# Patient Record
Sex: Female | Born: 1980 | Race: Black or African American | Hispanic: No
Health system: Southern US, Community
[De-identification: ages and names within clinical notes are randomized; demographics above are authoritative.]

---

## 2018-10-20 ENCOUNTER — Encounter: Payer: Self-pay | Admitting: Gastroenterology

## 2018-11-03 ENCOUNTER — Other Ambulatory Visit
Admission: RE | Admit: 2018-11-03 | Discharge: 2018-11-03 | Disposition: A | Discharge status: Home / Self Care | Payer: Medicaid Other | Source: Ambulatory Visit | Attending: Dermatopathology | Admitting: Dermatopathology

## 2018-11-03 DIAGNOSIS — D492 Neoplasm of unspecified behavior of bone, soft tissue, and skin: Secondary | ICD-10-CM | POA: Insufficient documentation

## 2018-11-04 LAB — SURGICAL PATHOLOGY

## 2019-01-20 ENCOUNTER — Telehealth: Payer: Self-pay

## 2019-01-20 NOTE — Telephone Encounter (Signed)
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

## 2019-01-21 NOTE — Telephone Encounter (Signed)
[place]

## 2020-07-09 ENCOUNTER — Emergency Department (HOSPITAL_COMMUNITY): Payer: Medicaid Other

## 2020-07-09 ENCOUNTER — Inpatient Hospital Stay (HOSPITAL_COMMUNITY): Payer: Medicaid Other

## 2020-07-09 ENCOUNTER — Inpatient Hospital Stay (HOSPITAL_COMMUNITY)
Admission: EM | Admit: 2020-07-09 | Discharge: 2020-07-12 | DRG: 062 | Disposition: A | Discharge status: Home / Self Care | Payer: Medicaid Other | Attending: Neurology | Admitting: Neurology

## 2020-07-09 ENCOUNTER — Other Ambulatory Visit: Payer: Self-pay

## 2020-07-09 ENCOUNTER — Encounter (HOSPITAL_COMMUNITY): Payer: Self-pay | Admitting: Neurology

## 2020-07-09 DIAGNOSIS — I6389 Other cerebral infarction: Secondary | ICD-10-CM | POA: Diagnosis not present

## 2020-07-09 DIAGNOSIS — Z20822 Contact with and (suspected) exposure to covid-19: Secondary | ICD-10-CM | POA: Diagnosis present

## 2020-07-09 DIAGNOSIS — I161 Hypertensive emergency: Secondary | ICD-10-CM | POA: Diagnosis present

## 2020-07-09 DIAGNOSIS — K5090 Crohn's disease, unspecified, without complications: Secondary | ICD-10-CM | POA: Diagnosis present

## 2020-07-09 DIAGNOSIS — E669 Obesity, unspecified: Secondary | ICD-10-CM | POA: Diagnosis present

## 2020-07-09 DIAGNOSIS — I739 Peripheral vascular disease, unspecified: Secondary | ICD-10-CM | POA: Diagnosis present

## 2020-07-09 DIAGNOSIS — I63311 Cerebral infarction due to thrombosis of right middle cerebral artery: Secondary | ICD-10-CM

## 2020-07-09 DIAGNOSIS — E785 Hyperlipidemia, unspecified: Secondary | ICD-10-CM | POA: Diagnosis present

## 2020-07-09 DIAGNOSIS — Z8673 Personal history of transient ischemic attack (TIA), and cerebral infarction without residual deficits: Secondary | ICD-10-CM

## 2020-07-09 DIAGNOSIS — G8194 Hemiplegia, unspecified affecting left nondominant side: Secondary | ICD-10-CM | POA: Diagnosis present

## 2020-07-09 DIAGNOSIS — Z6832 Body mass index (BMI) 32.0-32.9, adult: Secondary | ICD-10-CM | POA: Diagnosis not present

## 2020-07-09 DIAGNOSIS — R29704 NIHSS score 4: Secondary | ICD-10-CM | POA: Diagnosis present

## 2020-07-09 DIAGNOSIS — I10 Essential (primary) hypertension: Secondary | ICD-10-CM | POA: Diagnosis present

## 2020-07-09 DIAGNOSIS — I639 Cerebral infarction, unspecified: Principal | ICD-10-CM | POA: Diagnosis present

## 2020-07-09 DIAGNOSIS — I160 Hypertensive urgency: Secondary | ICD-10-CM | POA: Diagnosis present

## 2020-07-09 DIAGNOSIS — E782 Mixed hyperlipidemia: Secondary | ICD-10-CM | POA: Diagnosis not present

## 2020-07-09 DIAGNOSIS — Z9282 Status post administration of tPA (rtPA) in a different facility within the last 24 hours prior to admission to current facility: Secondary | ICD-10-CM | POA: Diagnosis not present

## 2020-07-09 DIAGNOSIS — G459 Transient cerebral ischemic attack, unspecified: Secondary | ICD-10-CM | POA: Diagnosis not present

## 2020-07-09 LAB — DIFFERENTIAL
Abs Immature Granulocytes: 0.01 10*3/uL (ref 0.00–0.07)
Basophils Absolute: 0 10*3/uL (ref 0.0–0.1)
Basophils Relative: 1 %
Eosinophils Absolute: 0.2 10*3/uL (ref 0.0–0.5)
Eosinophils Relative: 3 %
Immature Granulocytes: 0 %
Lymphocytes Relative: 38 %
Lymphs Abs: 2.1 10*3/uL (ref 0.7–4.0)
Monocytes Absolute: 0.6 10*3/uL (ref 0.1–1.0)
Monocytes Relative: 11 %
Neutro Abs: 2.6 10*3/uL (ref 1.7–7.7)
Neutrophils Relative %: 47 %

## 2020-07-09 LAB — COMPREHENSIVE METABOLIC PANEL
ALT: 13 U/L (ref 0–44)
AST: 21 U/L (ref 15–41)
Albumin: 3.3 g/dL — ABNORMAL LOW (ref 3.5–5.0)
Alkaline Phosphatase: 52 U/L (ref 38–126)
Anion gap: 11 (ref 5–15)
BUN: 20 mg/dL (ref 6–20)
CO2: 19 mmol/L — ABNORMAL LOW (ref 22–32)
Calcium: 8.9 mg/dL (ref 8.9–10.3)
Chloride: 105 mmol/L (ref 98–111)
Creatinine, Ser: 1.49 mg/dL — ABNORMAL HIGH (ref 0.44–1.00)
GFR, Estimated: 45 mL/min — ABNORMAL LOW (ref >60)
Glucose, Bld: 77 mg/dL (ref 70–99)
Potassium: 3.8 mmol/L (ref 3.5–5.1)
Sodium: 135 mmol/L (ref 135–145)
Total Bilirubin: 0.5 mg/dL (ref 0.3–1.2)
Total Protein: 8 g/dL (ref 6.5–8.1)

## 2020-07-09 LAB — I-STAT CHEM 8, ED
BUN: 24 mg/dL — ABNORMAL HIGH (ref 6–20)
Calcium, Ion: 0.95 mmol/L — ABNORMAL LOW (ref 1.15–1.40)
Chloride: 107 mmol/L (ref 98–111)
Creatinine, Ser: 1.5 mg/dL — ABNORMAL HIGH (ref 0.44–1.00)
Glucose, Bld: 79 mg/dL (ref 70–99)
HCT: 41 % (ref 36.0–46.0)
Hemoglobin: 13.9 g/dL (ref 12.0–15.0)
Potassium: 3.6 mmol/L (ref 3.5–5.1)
Sodium: 135 mmol/L (ref 135–145)
TCO2: 22 mmol/L (ref 22–32)

## 2020-07-09 LAB — CBC
HCT: 41.4 % (ref 36.0–46.0)
Hemoglobin: 12.9 g/dL (ref 12.0–15.0)
MCH: 25.7 pg — ABNORMAL LOW (ref 26.0–34.0)
MCHC: 31.2 g/dL (ref 30.0–36.0)
MCV: 82.6 fL (ref 80.0–100.0)
Platelets: 282 10*3/uL (ref 150–400)
RBC: 5.01 MIL/uL (ref 3.87–5.11)
RDW: 16.3 % — ABNORMAL HIGH (ref 11.5–15.5)
WBC: 5.6 10*3/uL (ref 4.0–10.5)
nRBC: 0 % (ref 0.0–0.2)

## 2020-07-09 LAB — I-STAT BETA HCG BLOOD, ED (MC, WL, AP ONLY): I-stat hCG, quantitative: <5 m[IU]/mL (ref <5)

## 2020-07-09 LAB — RESP PANEL BY RT-PCR (FLU A&B, COVID) ARPGX2
Influenza A by PCR: NEGATIVE
Influenza B by PCR: NEGATIVE
SARS Coronavirus 2 by RT PCR: NEGATIVE

## 2020-07-09 LAB — PROTIME-INR
INR: 1 (ref 0.8–1.2)
Prothrombin Time: 12.9 seconds (ref 11.4–15.2)

## 2020-07-09 LAB — HIV ANTIBODY (ROUTINE TESTING W REFLEX): HIV Screen 4th Generation wRfx: NONREACTIVE

## 2020-07-09 LAB — CBG MONITORING, ED: Glucose-Capillary: 77 mg/dL (ref 70–99)

## 2020-07-09 LAB — APTT: aPTT: 29 seconds (ref 24–36)

## 2020-07-09 MED ORDER — SODIUM CHLORIDE 0.9 % IV BOLUS
1000.0000 mL | Freq: Once | INTRAVENOUS | Status: AC
  Administered 2020-07-09: 1000 mL via INTRAVENOUS

## 2020-07-09 MED ORDER — CLEVIDIPINE BUTYRATE 0.5 MG/ML IV EMUL
0.0000 mg/h | INTRAVENOUS | Status: DC
  Administered 2020-07-09: 14 mg/h via INTRAVENOUS
  Administered 2020-07-09 (×2): 21 mg/h via INTRAVENOUS
  Administered 2020-07-09: 16 mg/h via INTRAVENOUS
  Administered 2020-07-09: 21 mg/h via INTRAVENOUS
  Administered 2020-07-09: 20 mg/h via INTRAVENOUS
  Administered 2020-07-09: 15 mg/h via INTRAVENOUS
  Administered 2020-07-10: 18 mg/h via INTRAVENOUS
  Administered 2020-07-10 (×2): 21 mg/h via INTRAVENOUS
  Administered 2020-07-10: 6 mg/h via INTRAVENOUS
  Administered 2020-07-11: 4 mg/h via INTRAVENOUS
  Filled 2020-07-09: qty 50
  Filled 2020-07-09 (×5): qty 100
  Filled 2020-07-09 (×3): qty 50
  Filled 2020-07-09 (×2): qty 100

## 2020-07-09 MED ORDER — SODIUM CHLORIDE 0.9% FLUSH
3.0000 mL | Freq: Once | INTRAVENOUS | Status: AC
  Administered 2020-07-09: 3 mL via INTRAVENOUS

## 2020-07-09 MED ORDER — IOHEXOL 350 MG/ML SOLN
51.0000 mL | Freq: Once | INTRAVENOUS | Status: AC | PRN
  Administered 2020-07-09: 51 mL via INTRAVENOUS

## 2020-07-09 MED ORDER — ACETAMINOPHEN 650 MG RE SUPP: 650.0000 mg | RECTAL | Status: DC | PRN

## 2020-07-09 MED ORDER — ACETAMINOPHEN 325 MG PO TABS
650.0000 mg | ORAL_TABLET | ORAL | Status: DC | PRN
  Administered 2020-07-09: 650 mg via ORAL
  Filled 2020-07-09: qty 2

## 2020-07-09 MED ORDER — SODIUM CHLORIDE 0.9 % IV SOLN: 50.0000 mL | Freq: Once | INTRAVENOUS | Status: DC

## 2020-07-09 MED ORDER — MAGNESIUM SULFATE IN D5W 1-5 GM/100ML-% IV SOLN
1.0000 g | Freq: Once | INTRAVENOUS | Status: AC
  Administered 2020-07-09: 1 g via INTRAVENOUS
  Filled 2020-07-09: qty 100

## 2020-07-09 MED ORDER — LABETALOL HCL 5 MG/ML IV SOLN: 20.0000 mg | Freq: Once | INTRAVENOUS | Status: AC

## 2020-07-09 MED ORDER — LABETALOL HCL 5 MG/ML IV SOLN
INTRAVENOUS | Status: AC
  Filled 2020-07-09: qty 4

## 2020-07-09 MED ORDER — LABETALOL HCL 5 MG/ML IV SOLN
INTRAVENOUS | Status: AC
  Administered 2020-07-09: 5 mg
  Filled 2020-07-09: qty 4

## 2020-07-09 MED ORDER — ORAL CARE MOUTH RINSE
15.0000 mL | Freq: Two times a day (BID) | OROMUCOSAL | Status: DC
  Administered 2020-07-09: 15 mL via OROMUCOSAL

## 2020-07-09 MED ORDER — DIPHENHYDRAMINE HCL 50 MG/ML IJ SOLN
25.0000 mg | Freq: Once | INTRAMUSCULAR | Status: AC
  Administered 2020-07-09: 25 mg via INTRAVENOUS
  Filled 2020-07-09: qty 1

## 2020-07-09 MED ORDER — NICARDIPINE HCL IN NACL 20-0.86 MG/200ML-% IV SOLN: 0.0000 mg/h | INTRAVENOUS | Status: DC | PRN

## 2020-07-09 MED ORDER — PANTOPRAZOLE SODIUM 40 MG IV SOLR
40.0000 mg | Freq: Every day | INTRAVENOUS | Status: DC
  Administered 2020-07-09 – 2020-07-10 (×2): 40 mg via INTRAVENOUS
  Filled 2020-07-09 (×2): qty 40

## 2020-07-09 MED ORDER — ALTEPLASE (STROKE) FULL DOSE INFUSION
0.9000 mg/kg | Freq: Once | INTRAVENOUS | Status: AC
  Administered 2020-07-09: 78.1 mg via INTRAVENOUS
  Filled 2020-07-09: qty 100

## 2020-07-09 MED ORDER — LABETALOL HCL 5 MG/ML IV SOLN
10.0000 mg | INTRAVENOUS | Status: DC | PRN
  Administered 2020-07-11 (×2): 10 mg via INTRAVENOUS
  Filled 2020-07-09 (×3): qty 4

## 2020-07-09 MED ORDER — ACETAMINOPHEN 160 MG/5ML PO SOLN: 650.0000 mg | ORAL | Status: DC | PRN

## 2020-07-09 MED ORDER — CLEVIDIPINE BUTYRATE 0.5 MG/ML IV EMUL
INTRAVENOUS | Status: AC
  Administered 2020-07-09: 1 mg/h
  Filled 2020-07-09: qty 50

## 2020-07-09 MED ORDER — SODIUM CHLORIDE 0.9 % IV SOLN
50.0000 mL/h | INTRAVENOUS | Status: DC
  Administered 2020-07-09: 50 mL/h via INTRAVENOUS

## 2020-07-09 MED ORDER — STROKE: EARLY STAGES OF RECOVERY BOOK
Freq: Once | Status: AC
  Administered 2020-07-10: 1
  Filled 2020-07-09: qty 1

## 2020-07-09 MED ORDER — LABETALOL HCL 5 MG/ML IV SOLN: 10.0000 mg | Freq: Once | INTRAVENOUS | Status: DC | PRN

## 2020-07-09 MED ORDER — ACETAMINOPHEN 325 MG PO TABS: 650.0000 mg | ORAL_TABLET | Freq: Once | ORAL | Status: DC

## 2020-07-09 NOTE — H&P (Addendum)
Neurology Admission H&P    Chief Complaint: left sided weakness with left face and arm numbness and tingling   HPI: Tanya Hamilton is a 40 yo female with a PMHx of HTN of Chrohn's who presented today as a code stroke. Patient was at work, and began to feel left sided weakness with noted left face and left arm numbness and tingling. Patient called 911 herself. Per EMS, she was showing left sided weakness en route, but no aphasia, HA, or dysarthria. No gaze preference.  CBG 94 and BP 222/170 en route.   After a brief exam on the ED bridge with airway clearance, patient was taken emergently to CT suite. CTH was negative for acute abnormality, but given strong suspicion for stroke, tPA was offered. Risk and benefits along with inclusion/exclusion criteria discussed with patient by Dr. Amada Jupiter and patient agreed with tPA. Prior to tPA, patient's BP was over 200. Labetolol 20mg  was given, then Clevipex started. After titration of Clexiprex to 16, her BP was within tPA guidelines and tPA was started.   Patient just moved here from 3 months ago. Hasn't established with a PCP yet. States she takes Losartan and Stelara. She denies previous stroke and has no FMHx of stroke.  LKW: 1335 tPA- around 1455-see charted time.  IR?-No, no LVO MRS-0  PMHx: HTN, Crohn's disease  History reviewed. No pertinent family history.   Social History: married, children, works. + tobacco use.   Allergies: Not on File  ROS: Negative ROS except as noted in HPI.   Physical Examination: General: Appears well, NAD.  Psych: Affect appropriate. Calm. Cooperative.  HEENT-  Normocephalic, AT. No lymphadenopathy. No thyromegaly. No stiffness of neck.  Cardiovascular - RRR. No LE edema.  Lungs - Normal respiratory effort. Abdomen - soft, NT. Extremities - warm, well perfused. Skin: WDI.  NEURO:   NIHSS:  1a Level of Consciousness: 0 1b LOC Questions: 0 1c LOC Commands: 0 2 Best Gaze: 0 3 Visual: 0 4 Facial  Palsy: 1 5a Motor Arm - left: 1 5b Motor Arm - Right: 0 6a Motor Leg - Left: 1 6b Motor Leg - Right: 0 7 Limb Ataxia: 0 8 Sensory: 1 9 Best Language: 0 10 Dysarthria: 0 11 Extinct and Inattention: 0 TOTAL: 4  Mental Status: Awake and alert. Is able to follow simple commands. Oriented to person, place, month, day, date. Able to relay why here.  Speech/Language: speech is without dysarthria.  No aphasia or neglect. No gaze preference. Naming, repetition, fluency, and comprehension intact.  Cranial Nerves:  II: PERRL. Visual fields full.  III, IV, VI: EOMI. Eyelids elevate symmetrically. Blinks to threat.  V: Sensation is intact to light touch and symmetrical to face.  VII: Light left facial droop.  IX, X: Palate elevates symmetrically. Phonation is normal.  [place] shrug 5/5. XII: tongue is midline without fasciculations. Motor:  RUE:  grip   4+/5    biceps  4+/5    triceps  4+/5     LUE: grip 4/5    biceps   4/5    triceps  4/5 RLE: thigh  5/5   knee   5/5   plantar flexion   5/5    dorsiflexion   5/5 LLE: thigh  4-/5    knee  4-/5   plantar flexion  4-/5   dorsiflexion  4-/5 Tone is normal and bulk is normal Sensation- Intact to light touch bilaterally. Extinction absent to light touch to DSS.  Coordination: FTN intact bilaterally.  Drift LUE/LLE Plantars: downgoing.  Gait- deferred  Labs pertinent to this encounter: INR 1     aPTT   29      Imaging: Independently reviewed by MD.   CTH 1. No evidence of acute large vascular territory infarct or acute hemorrhage. ASPECTS is 10. 2. Remote appearing left corona radiata infarct and mild patchy white matter hypoattenuation, most likely related to chronic microvascular ischemic disease. MRI could provide more sensitive evaluation for acute infarct if clinically indicated.  CTA head and neck- ________________________  Assessment: 40 yo female new to this area. Stroke risk factor of uncontrolled HTN and tobacco abuse.  Strong suspicion of stroke due to patient's symptoms and hypertension urgency. After patient consent and BP stabilized with Labetalol and Cleviprex, tPA was given. Patient will be admitted to ICU because of tPA infusion.   Plan: -admit by neurology.  -No anti platelets or AC x 24 hrs. Further AC or DAPT per team in a.m.  -No non compressible sticks ,invasive procedures, or tube placement x 24 hours.  -BP goal is below 180/105. -CTH 6 hours after tPA given or stat for any acute change in neurological status.  -MRI brain in 12 hours. -Cleviprex titration for BP goal of < 180/105.  -HgbA1c, fasting lipid panel, TSH.  -high intensity statin is LDL > 70. Do not start statin until further imaging is negative for bleed.  -No chemical VTE. SCDs only.  -Echocardiogram with bubble study. -Frequent neuro checks per stroke protocol.  -NIHSS per protocol.  - Risk factor modification. -Telemetry monitoring for arrhythmia.  -PT consult, OT consult, Speech consult. -infectious workup-CBC diff, UA and culture, LA, CXR, CK.  -stroke education.  -tobacco cessation if applicable.  - Stroke team to follow.  Patient seen by Jimmye Norman, NP/Neurology and MD. Note and plan to be edited by MD as necessary.    I have seen the patient and reviewed the above note.  I was present for the entirety of the evaluation and management reflected in the above note.  She has mild left-sided weakness, but given the degree of fine motor movement weakness in her left hand as well as the degree of weakness in her left leg I think that this could be a disabling deficit and therefore offered IV tPA.  After discussion of the risks and benefits of IV tPA with the patient, she agreed to proceed and tPA was started.  She had severe hypertension that was refractory to initial treatment with labetalol, requiring a continuous Cleviprex drip which did delay tPA to some degree.  This patient is critically ill and at significant  risk of neurological worsening, death and care requires constant monitoring of vital signs, hemodynamics,respiratory and cardiac monitoring, neurological assessment, discussion with family, other specialists and medical decision making of high complexity. I spent 60 minutes of neurocritical care time  in the care of  this patient. This was time spent independent of any time provided by nurse practitioner or PA.  Ritta Slot, MD [facility] [phone]  If 7pm- 7am, please page neurology on call as listed in AMION. 07/09/2020  7:32 PM

## 2020-07-09 NOTE — ED Provider Notes (Signed)
[facility] NEURO/TRAUMA/SURGICAL ICU Provider Note   CSN: [id number] Arrival date & time: 07/09/20  1424  An emergency department physician performed an initial assessment on this suspected stroke patient at 1424.  History Chief Complaint  Patient presents with  . Code Stroke    Tanya Hamilton is a 40 y.o. female.  HPI   40 year old female past medical history of HTN presents the emergency department concern for left-sided weakness.  This started acutely less than an hour prior to arrival.  Evaluated as a stroke page at the EMS bridge with neurology at bedside, sent straight to the Scanner.  Patient otherwise sitting up, conversational protecting her airway.   History reviewed. No pertinent past medical history.  Patient Active Problem List   Diagnosis Date Noted  . 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 No pertinent surgical history.   OB History   No obstetric history on file.     History reviewed. No pertinent family history.  Social History   Tobacco Use  . Smoking status: Unknown If Ever Smoked    Home Medications Prior to Admission medications   Not on File    Allergies    Patient has no allergy information on record.  Review of Systems   Review of Systems  Unable to perform ROS: Acuity of condition    Physical Exam Updated Vital Signs BP (!) 150/112   Pulse (!) 103   Temp 98.3 F (36.8 C)   Resp 15   Ht 5\' 4"  (1.626 m)   Wt 87.1 kg   SpO2 95%   BMI 32.96 kg/m   Physical Exam Vitals and nursing note reviewed.  Constitutional:      Appearance: Normal appearance.  HENT:     Head: Normocephalic.     Mouth/Throat:     Mouth: Mucous membranes are moist.  Eyes:     Pupils: Pupils are equal, round, and reactive to light.  Cardiovascular:     Rate and Rhythm: Tachycardia present.  Pulmonary:     Effort: Pulmonary effort is normal. No respiratory distress.  Abdominal:     Palpations: Abdomen is soft.  Skin:    General:  Skin is warm.  Neurological:     Mental Status: She is alert and oriented to person, place, and time.     Comments: Weakness in the left upper and lower extremity  Psychiatric:        Mood and Affect: Mood normal.     ED Results / Procedures / Treatments   Labs (all labs ordered are listed, but only abnormal results are displayed) Labs Reviewed  CBC - Abnormal; Notable for the following components:      Result Value   MCH 25.7 (*)    RDW 16.3 (*)    All other components within normal limits  COMPREHENSIVE METABOLIC PANEL - Abnormal; Notable for the following components:   CO2 19 (*)    Creatinine, Ser 1.49 (*)    Albumin 3.3 (*)    GFR, Estimated 45 (*)    All other components within normal limits  I-STAT CHEM 8, ED - Abnormal; Notable for the following components:   BUN 24 (*)    Creatinine, Ser 1.50 (*)    Calcium, Ion 0.95 (*)    All other components within normal limits  MRSA PCR SCREENING  RESP PANEL BY RT-PCR (FLU A&B, COVID) ARPGX2  PROTIME-INR  APTT  DIFFERENTIAL  HIV ANTIBODY (ROUTINE TESTING W REFLEX)  CBG MONITORING, ED  I-STAT BETA HCG BLOOD, ED ([facility], [facility], [facility] ONLY)    EKG None  Radiology CT HEAD CODE STROKE WO CONTRAST  Result Date: 07/09/2020 CLINICAL DATA:  Code stroke.  Left-sided weakness. EXAM: CT HEAD WITHOUT CONTRAST TECHNIQUE: Contiguous axial images were obtained from the base of the skull through the vertex without intravenous contrast. COMPARISON:  None. FINDINGS: Brain: No evidence of acute large vascular territory infarct or acute hemorrhage. Remote appearing left corona radiata infarct. No hydrocephalus, mass lesion, mass effect, or sizable extra-axial fluid collection. Mild patchy white matter hypoattenuation is nonspecific but most likely related to chronic microvascular ischemic disease. Vascular: No hyperdense vessel identified. Skull: No acute fracture. Sinuses/Orbits: Clear visualized sinuses. Other: No mastoid effusions. ASPECTS [facility]  Stroke Program Early CT Score) Total score (0-10 with 10 being normal): 10. IMPRESSION: 1. No evidence of acute large vascular territory infarct or acute hemorrhage. ASPECTS is 10. 2. Remote appearing left corona radiata infarct and mild patchy white matter hypoattenuation, most likely related to chronic microvascular ischemic disease. MRI could provide more sensitive evaluation for acute infarct if clinically indicated. Code stroke imaging results were communicated on 07/09/2020 at 2:52 pm to provider Dr. Amada Jupiter via secure text paging. Electronically Signed   By: Feliberto Harts MD   On: 07/09/2020 14:53    Procedures .Critical Care Performed by: Rozelle Logan, DO Authorized by: Rozelle Logan, DO   Critical care provider statement:    Critical care time (minutes):  45   Critical care was necessary to treat or prevent imminent or life-threatening deterioration of the following conditions:  CNS failure or compromise   Critical care was time spent personally by me on the following activities:  Discussions with consultants, evaluation of patient's response to treatment, examination of patient, ordering and performing treatments and interventions, ordering and review of laboratory studies, ordering and review of radiographic studies, pulse oximetry, re-evaluation of patient's condition, obtaining history from patient or surrogate and review of old charts     Medications Ordered in ED Medications   stroke: mapping our early stages of recovery book (has no administration in time range)  0.9 %  sodium chloride infusion (has no administration in time range)  acetaminophen (TYLENOL) tablet 650 mg (has no administration in time range)    Or  acetaminophen (TYLENOL) 160 MG/5ML solution 650 mg (has no administration in time range)    Or  acetaminophen (TYLENOL) suppository 650 mg (has no administration in time range)  pantoprazole (PROTONIX) injection 40 mg (has no administration in time  range)  alteplase (ACTIVASE) 1 mg/mL infusion 78.1 mg (78.1 mg Intravenous New Bag/Given 07/09/20 1456)    Followed by  0.9 %  sodium chloride infusion (has no administration in time range)  clevidipine (CLEVIPREX) infusion 0.5 mg/mL (14 mg/hr Intravenous New Bag/Given 07/09/20 1601)  sodium chloride flush (NS) 0.9 % injection 3 mL (3 mLs Intravenous Given 07/09/20 1436)  labetalol (NORMODYNE) injection 20 mg (5 mg Intravenous Given 07/09/20 1436)  iohexol (OMNIPAQUE) 350 MG/ML injection 51 mL (51 mLs Intravenous Contrast Given 07/09/20 1523)    ED Course  I have reviewed the triage vital signs and the nursing notes.  Pertinent labs & imaging results that were available during my care of the patient were reviewed by me and considered in my medical decision making (see chart for details).    MDM Rules/Calculators/A&P  40 year old female presents the emergency department with left-sided weakness that started acutely prior to arrival.  She is weak on neuro exam, hypertensive.  Evaluated as a stroke page at the EMS bridge, neurology team at bedside.  Head CT shows no acute abnormality, decision made to give the patient tPA, blood pressure control is currently being achieved by pharmacy who is also bedside.  tPA was started, patient admitted to the neurointensive care team, stable at time of admission.  Final Clinical Impression(s) / ED Diagnoses Final diagnoses:  None    Rx / DC Orders ED Discharge Orders    None       Rozelle Logan, DO 07/09/20 1624

## 2020-07-09 NOTE — Progress Notes (Signed)
Repeat scans clarified as the H&P note states to do a CTH 6 hours post-tPa and MRI 12 hours post-tPa but the orders placed are for 24 hours post-tPa.   Per Dr. Derry Lory perform scans as per usual protocol 24 hours post-tPa unless there are neuro changes.

## 2020-07-09 NOTE — Progress Notes (Signed)
Pharmacist Code Stroke Response  Notified to mix tPA at 1435 by Dr. Amada Jupiter Delivered tPA to RN at 1441  tPA dose = 7.8mg  bolus over 1 minute followed by 70.3 mg for a total dose of 78.1mg  over 1 hour  Issues/delays encountered (if applicable): blood pressure control. Required labetalol and cleviprex infusion.   Vinnie Level, PharmD., BCPS, BCCCP Clinical Pharmacist Please refer to [facility] for unit-specific pharmacist

## 2020-07-09 NOTE — ED Triage Notes (Signed)
PT BIB EMS CODE STROKE. PT LKN 1335. Pt reports sudden onset of left arm & leg weakness. Pt has h/o hypertension.

## 2020-07-09 NOTE — Progress Notes (Signed)
Pt complaining of new onset of a headache neuro assessment unchanged, Dr, Derry Lory aware, [facility] ordered.   During CT pt began complaining of slight L sided weakness, see documentation. Dr. Derry Lory aware.    Per Dr, Derry Lory, [facility] was negative for a bleed new orders received.

## 2020-07-09 NOTE — ED Notes (Signed)
Pt never went to room.Pt went from CT To ICU room

## 2020-07-09 NOTE — Progress Notes (Signed)
PT Cancellation Note  Patient Details Name: Tanya Hamilton MRN: [id number] DOB: [birth year]   Cancelled Treatment:    Reason Eval/Treat Not Completed: Active bedrest order. Will follow-up another day as appropriate.    Raymond Gurney, PT, DPT Acute Rehabilitation Services  Pager: [phone] Office: [phone]    Jewel Baize 07/09/2020, 5:43 PM

## 2020-07-10 ENCOUNTER — Other Ambulatory Visit (HOSPITAL_COMMUNITY): Payer: Medicaid Other

## 2020-07-10 ENCOUNTER — Inpatient Hospital Stay (HOSPITAL_COMMUNITY): Payer: Medicaid Other

## 2020-07-10 DIAGNOSIS — E782 Mixed hyperlipidemia: Secondary | ICD-10-CM

## 2020-07-10 DIAGNOSIS — I161 Hypertensive emergency: Secondary | ICD-10-CM

## 2020-07-10 DIAGNOSIS — I63311 Cerebral infarction due to thrombosis of right middle cerebral artery: Secondary | ICD-10-CM

## 2020-07-10 LAB — LIPID PANEL
Cholesterol: 187 mg/dL (ref 0–200)
HDL: 41 mg/dL (ref >40)
LDL Cholesterol: 75 mg/dL (ref 0–99)
Total CHOL/HDL Ratio: 4.6 RATIO
Triglycerides: 353 mg/dL — ABNORMAL HIGH (ref <150)
VLDL: 71 mg/dL — ABNORMAL HIGH (ref 0–40)

## 2020-07-10 LAB — BASIC METABOLIC PANEL
Anion gap: 9 (ref 5–15)
BUN: 12 mg/dL (ref 6–20)
CO2: 20 mmol/L — ABNORMAL LOW (ref 22–32)
Calcium: 8.6 mg/dL — ABNORMAL LOW (ref 8.9–10.3)
Chloride: 104 mmol/L (ref 98–111)
Creatinine, Ser: 1.07 mg/dL — ABNORMAL HIGH (ref 0.44–1.00)
GFR, Estimated: >60 mL/min (ref >60)
Glucose, Bld: 125 mg/dL — ABNORMAL HIGH (ref 70–99)
Potassium: 3.7 mmol/L (ref 3.5–5.1)
Sodium: 133 mmol/L — ABNORMAL LOW (ref 135–145)

## 2020-07-10 LAB — CBC
HCT: 41.1 % (ref 36.0–46.0)
Hemoglobin: 13.5 g/dL (ref 12.0–15.0)
MCH: 25.7 pg — ABNORMAL LOW (ref 26.0–34.0)
MCHC: 32.8 g/dL (ref 30.0–36.0)
MCV: 78.3 fL — ABNORMAL LOW (ref 80.0–100.0)
Platelets: 361 10*3/uL (ref 150–400)
RBC: 5.25 MIL/uL — ABNORMAL HIGH (ref 3.87–5.11)
RDW: 16 % — ABNORMAL HIGH (ref 11.5–15.5)
WBC: 6.8 10*3/uL (ref 4.0–10.5)
nRBC: 0 % (ref 0.0–0.2)

## 2020-07-10 LAB — MRSA PCR SCREENING: MRSA by PCR: NEGATIVE

## 2020-07-10 LAB — RAPID URINE DRUG SCREEN, HOSP PERFORMED
Amphetamines: NOT DETECTED
Barbiturates: NOT DETECTED
Benzodiazepines: NOT DETECTED
Cocaine: NOT DETECTED
Opiates: NOT DETECTED
Tetrahydrocannabinol: NOT DETECTED

## 2020-07-10 MED ORDER — METOPROLOL TARTRATE 50 MG PO TABS
50.0000 mg | ORAL_TABLET | Freq: Three times a day (TID) | ORAL | Status: DC
  Administered 2020-07-10 – 2020-07-12 (×5): 50 mg via ORAL
  Filled 2020-07-10 (×5): qty 1

## 2020-07-10 MED ORDER — AMLODIPINE BESYLATE 5 MG PO TABS
5.0000 mg | ORAL_TABLET | Freq: Once | ORAL | Status: AC
  Administered 2020-07-10: 5 mg via ORAL
  Filled 2020-07-10: qty 1

## 2020-07-10 MED ORDER — CALAMINE EX LOTN
TOPICAL_LOTION | CUTANEOUS | Status: DC | PRN
  Filled 2020-07-10: qty 177

## 2020-07-10 MED ORDER — TRIAMCINOLONE ACETONIDE 0.1 % EX LOTN
TOPICAL_LOTION | Freq: Two times a day (BID) | CUTANEOUS | Status: DC
  Administered 2020-07-11 – 2020-07-12 (×2): 1 via TOPICAL
  Filled 2020-07-10: qty 60

## 2020-07-10 MED ORDER — AMLODIPINE BESYLATE 10 MG PO TABS
10.0000 mg | ORAL_TABLET | Freq: Every morning | ORAL | Status: DC
  Administered 2020-07-11 – 2020-07-12 (×2): 10 mg via ORAL
  Filled 2020-07-10 (×3): qty 1

## 2020-07-10 MED ORDER — ATORVASTATIN CALCIUM 40 MG PO TABS
40.0000 mg | ORAL_TABLET | Freq: Every day | ORAL | Status: DC
  Administered 2020-07-10 – 2020-07-12 (×3): 40 mg via ORAL
  Filled 2020-07-10 (×3): qty 1

## 2020-07-10 MED ORDER — CHLORHEXIDINE GLUCONATE CLOTH 2 % EX PADS
6.0000 | MEDICATED_PAD | Freq: Every day | CUTANEOUS | Status: DC
  Administered 2020-07-10 – 2020-07-12 (×3): 6 via TOPICAL

## 2020-07-10 MED ORDER — ADULT MULTIVITAMIN W/MINERALS CH
1.0000 | ORAL_TABLET | Freq: Every day | ORAL | Status: DC
  Administered 2020-07-10 – 2020-07-11 (×2): 1 via ORAL
  Filled 2020-07-10 (×2): qty 1

## 2020-07-10 MED ORDER — METOPROLOL TARTRATE 50 MG PO TABS: 50.0000 mg | ORAL_TABLET | Freq: Three times a day (TID) | ORAL | Status: DC

## 2020-07-10 MED ORDER — LABETALOL HCL 100 MG PO TABS
200.0000 mg | ORAL_TABLET | Freq: Three times a day (TID) | ORAL | Status: DC
  Administered 2020-07-10: 200 mg via ORAL
  Filled 2020-07-10: qty 2

## 2020-07-10 MED ORDER — AMLODIPINE BESYLATE 5 MG PO TABS
5.0000 mg | ORAL_TABLET | Freq: Every morning | ORAL | Status: DC
  Administered 2020-07-10: 5 mg via ORAL
  Filled 2020-07-10: qty 1

## 2020-07-10 MED ORDER — EZETIMIBE 10 MG PO TABS
10.0000 mg | ORAL_TABLET | Freq: Every day | ORAL | Status: DC
  Administered 2020-07-10 – 2020-07-12 (×3): 10 mg via ORAL
  Filled 2020-07-10 (×3): qty 1

## 2020-07-10 NOTE — Evaluation (Signed)
Physical Therapy Evaluation Patient Details Name: Tanya Hamilton MRN: [id number] DOB: 06/18/1980 Today's Date: 07/10/2020   History of Present Illness  The pt is a 40 yo female presenting 5/29 with sudden onset L-sided weakness and numbness. Pt hypertensive upon arrival, received tPA on 5/29. MRI pending. PMH includes: HTN, Crohn's disease.    Clinical Impression  Pt in bed upon arrival of PT, agreeable to evaluation at this time. Prior to admission the pt was completely independent with all activity, walking over an hour to work each day, living in an apt with family that has 3 flights of stairs to enter. The pt now presents with minor limitations in dynamic stability and activity tolerance, but will be safe to return home with family supervision when medically cleared. The pt was able to demo good independence with bed mobility, sit-stand transfers, and short distance ambulation in the room without UE support or instability. Supervision for hallway ambulation for safety, but the pt was able to complete without overt LOB, need for UE support, and while completing tasks on her phone. The pt's BP remained elevated through session, but did not change significantly following activity or stair navigation. The pt would continue to benefit from skilled PT to continue progression of endurance and activity tolerance while in house, will not need follow up therapies after d/c.     Follow Up Recommendations No PT follow up    Equipment Recommendations  None recommended by PT    Recommendations for Other Services       Precautions / Restrictions Precautions Precautions: Other (comment) Precaution Comments: watch BP Restrictions Weight Bearing Restrictions: No      Mobility  Bed Mobility Overal bed mobility: Independent                  Transfers Overall transfer level: Independent                  Ambulation/Gait Ambulation/Gait assistance: Supervision Gait Distance (Feet):  100 Feet Assistive device: None Gait Pattern/deviations: Step-through pattern;Decreased stride length Gait velocity: functional Gait velocity interpretation: 1.31 - 2.62 ft/sec, indicative of limited community ambulator General Gait Details: pt with generally functional but slowed gait this session. She was able to maintain stability without UE support, and follow directional cues while looking at her phone without issue.  Stairs Stairs: Yes Stairs assistance: Supervision Stair Management: One rail Right;Alternating pattern;Forwards Number of Stairs: 12 General stair comments: pt with good stability, use of single rail. no significant change in BP following stair activity  Wheelchair Mobility    Modified Rankin (Stroke Patients Only) Modified Rankin (Stroke Patients Only) Pre-Morbid Rankin Score: No symptoms Modified Rankin: Moderately severe disability     Balance Overall balance assessment: Mild deficits observed, not formally tested   Sitting balance-Leahy Scale: Normal     Standing balance support: No upper extremity supported Standing balance-Leahy Scale: Good       Tandem Stance - Right Leg: 3 Tandem Stance - Left Leg: 8 [place] - Eyes Opened: 15 [place] - Eyes Closed: 15                 Pertinent Vitals/Pain Pain Assessment: No/denies pain    Home Living Family/patient expects to be discharged to:: Private residence Living Arrangements: Other relatives Available Help at Discharge: Family;Available PRN/intermittently Type of Home: Apartment Home Access: Stairs to enter Entrance Stairs-Rails: Right;Left Entrance Stairs-Number of Steps: 3 flights Home Layout: Other (Comment);One level (3rd floor apt) Home Equipment: None Additional Comments: walks over an  hour to work daily. lives with sister in law and her 83 yo son ( x2 nieces 13/ 16 yr in house). Daughter lives in [place] but is unable to help due to caring for her kids    Prior Function Level  of Independence: Independent               Hand Dominance   Dominant Hand: Right    Extremity/Trunk Assessment   Upper Extremity Assessment Upper Extremity Assessment: Overall WFL for tasks assessed    Lower Extremity Assessment Lower Extremity Assessment: Overall WFL for tasks assessed (pt denies numbness or changes in sensation to either LE, good strength)    Cervical / Trunk Assessment Cervical / Trunk Assessment: Normal  Communication   Communication: No difficulties  Cognition Arousal/Alertness: Awake/alert Behavior During Therapy: WFL for tasks assessed/performed Overall Cognitive Status: Within Functional Limits for tasks assessed                                        General Comments General comments (skin integrity, edema, etc.): BP 142/119 following stairs, DBP maintained above 100 at rest and with activity        Assessment/Plan    PT Assessment Patient needs continued PT services  PT Problem List Decreased range of motion;Decreased activity tolerance;Decreased balance;Decreased mobility;Decreased coordination       PT Treatment Interventions DME instruction;Gait training;Stair training;Functional mobility training;Therapeutic activities;Therapeutic exercise;Balance training;Patient/family education    PT Goals (Current goals can be found in the Care Plan section)  Acute Rehab PT Goals Patient Stated Goal: to return home PT Goal Formulation: With patient Time For Goal Achievement: 07/24/20 Potential to Achieve Goals: Good    Frequency Min 4X/week    AM-PAC PT "6 Clicks" Mobility  Outcome Measure Help needed turning from your back to your side while in a flat bed without using bedrails?: None Help needed moving from lying on your back to sitting on the side of a flat bed without using bedrails?: None Help needed moving to and from a bed to a chair (including a wheelchair)?: A Little Help needed standing up from a chair using  your arms (e.g., wheelchair or bedside chair)?: A Little Help needed to walk in hospital room?: A Little Help needed climbing 3-5 steps with a railing? : A Little 6 Click Score: 20    End of Session   Activity Tolerance: Patient tolerated treatment well Patient left: in bed;with call bell/phone within reach Nurse Communication: Mobility status PT Visit Diagnosis: Unsteadiness on feet (R26.81);Other abnormalities of gait and mobility (R26.89)    Time: 6962-9528 PT Time Calculation (min) (ACUTE ONLY): 12 min   Charges:   PT Evaluation $PT Eval Low Complexity: 1 Low          Rolm Baptise, PT, DPT   Acute Rehabilitation Department Pager #: [phone]  Gaetana Michaelis 07/10/2020, 11:57 AM

## 2020-07-10 NOTE — Progress Notes (Signed)
PT Cancellation Note  Patient Details Name: Tanya Hamilton MRN: [id number] DOB: 1980/04/05   Cancelled Treatment:    Reason Eval/Treat Not Completed: Active bedrest order remains this morning. PT will continue to follow and evaluate as appropriate.   Rolm Baptise, PT, DPT   Acute Rehabilitation Department Pager #: [phone]   Gaetana Michaelis 07/10/2020, 7:46 AM

## 2020-07-10 NOTE — Evaluation (Signed)
Occupational Therapy Evaluation Patient Details Name: Tanya Hamilton MRN: [id number] DOB: 08-17-1980 Today's Date: 07/10/2020    History of Present Illness The pt is a 40 yo female presenting 5/29 with sudden onset L-sided weakness and numbness. Pt hypertensive upon arrival, received tPA on 5/29. MRI pending. PMH includes: HTN, Crohn's disease.   Clinical Impression   Patient evaluated by Occupational Therapy with no further acute OT needs identified. All education has been completed and the patient has no further questions. See below for any follow-up Occupational Therapy or equipment needs. OT to sign off. Thank you for referral.   Attempting to educate patient on BP monitoring and reports it has been high due to medications for awhile. Pt less receptive to education on BP elevation at this time.   OT  And Pt overlaying with to hand out patient while exiting the room.     Follow Up Recommendations  No OT follow up    Equipment Recommendations  None recommended by OT    Recommendations for Other Services       Precautions / Restrictions Precautions Precautions: Other (comment) Precaution Comments: watch BP      Mobility Bed Mobility Overal bed mobility: Independent                  Transfers Overall transfer level: Independent                    Balance                                           ADL either performed or assessed with clinical judgement   ADL Overall ADL's : Independent                                             Vision Baseline Vision/History:  (reports baseline visual acuity changes) Patient Visual Report: Blurring of vision Vision Assessment?: No apparent visual deficits     Perception     Praxis      Pertinent Vitals/Pain Pain Assessment: No/denies pain     Hand Dominance Right   Extremity/Trunk Assessment Upper Extremity Assessment Upper Extremity Assessment: Overall WFL for tasks  assessed   Lower Extremity Assessment Lower Extremity Assessment: Overall WFL for tasks assessed   Cervical / Trunk Assessment Cervical / Trunk Assessment: Normal   Communication Communication Communication: No difficulties   Cognition Arousal/Alertness: Awake/alert Behavior During Therapy: WFL for tasks assessed/performed Overall Cognitive Status: Within Functional Limits for tasks assessed                                     General Comments  BP 150/106 with movement 142/104    Exercises     Shoulder Instructions      Home Living Family/patient expects to be discharged to:: Private residence Living Arrangements: Other relatives Available Help at Discharge: Family;Available PRN/intermittently Type of Home: Apartment       Home Layout: Other (Comment) (3rd floor)     Bathroom Shower/Tub: Tub/shower unit   Bathroom Toilet: Standard     Home Equipment: None   Additional Comments: walks over an hour to work daily. lives with sister in law and her 76  yo son ( x2 nieces 13/ 16 yr in house). Daughter lives in [place] but is unable to help due to caring for her kids      Prior Functioning/Environment Level of Independence: Independent                 OT Problem List:        OT Treatment/Interventions:      OT Goals(Current goals can be found in the care plan section) Acute Rehab OT Goals Patient Stated Goal: to return home  OT Frequency:     Barriers to D/C:            Co-evaluation              AM-PAC OT "6 Clicks" Daily Activity     Outcome Measure Help from another person eating meals?: None Help from another person taking care of personal grooming?: None Help from another person toileting, which includes using toliet, bedpan, or urinal?: None Help from another person bathing (including washing, rinsing, drying)?: None Help from another person to put on and taking off regular upper body clothing?: None Help from another  person to put on and taking off regular lower body clothing?: None 6 Click Score: 24   End of Session Nurse Communication: Mobility status;Precautions  Activity Tolerance: Patient tolerated treatment well Patient left: Other (comment) (up with PT katie)  OT Visit Diagnosis: Unsteadiness on feet (R26.81)                Time: 5427-0623 OT Time Calculation (min): 19 min Charges:  OT General Charges $OT Visit: 1 Visit OT Evaluation $OT Eval Low Complexity: 1 Low   Brynn, OTR/L  Acute Rehabilitation Services Pager: [phone] Office: [phone] .   Mateo Flow 07/10/2020, 11:33 AM

## 2020-07-10 NOTE — Progress Notes (Signed)
SLP Cancellation Note  Patient Details Name: Markeita Alicia MRN: [id number] DOB: 08-20-1980   Cancelled treatment:       Reason Eval/Treat Not Completed: SLP screened, no needs identified, will sign off  Amahd Morino L. Samson Frederic, MA CCC/SLP Acute Rehabilitation Services Office number [phone] Pager [phone]  Carolan Shiver 07/10/2020, 2:59 PM

## 2020-07-10 NOTE — Progress Notes (Signed)
STROKE TEAM PROGRESS NOTE   SUBJECTIVE (INTERVAL HISTORY) Her RN is at the bedside.  Overall her condition is completely resolved. Pt stated that she came from [place] 3 months ago, no PCP here yet. She has 4 BP meds PTA but she said her BP still high at 180s most of the time for a long time.    OBJECTIVE Temp:  [97.9 F (36.6 C)-98.3 F (36.8 C)] 98.3 F (36.8 C) (05/30 0800) Pulse Rate:  [95-107] 102 (05/30 0930) Cardiac Rhythm: Sinus tachycardia (05/30 0800) Resp:  [12-44] 15 (05/30 0930) BP: (129-210)/(85-167) 152/113 (05/30 0930) SpO2:  [88 %-100 %] 100 % (05/30 0930) FiO2 (%):  [31 %] 31 % (05/29 2332) Weight:  [86.8 kg-87.1 kg] 87.1 kg (05/29 1545)  Recent Labs  Lab 07/09/20 1427  GLUCAP 77   Recent Labs  Lab 07/09/20 1426 07/09/20 1431 07/10/20 0800  NA 135 135 133*  K 3.8 3.6 3.7  CL 105 107 104  CO2 19*  --  20*  GLUCOSE 77 79 125*  BUN 20 24* 12  CREATININE 1.49* 1.50* 1.07*  CALCIUM 8.9  --  8.6*   Recent Labs  Lab 07/09/20 1426  AST 21  ALT 13  ALKPHOS 52  BILITOT 0.5  PROT 8.0  ALBUMIN 3.3*   Recent Labs  Lab 07/09/20 1426 07/09/20 1431 07/10/20 0800  WBC 5.6  --  6.8  NEUTROABS 2.6  --   --   HGB 12.9 13.9 13.5  HCT 41.4 41.0 41.1  MCV 82.6  --  78.3*  PLT 282  --  361   No results for input(s): CKTOTAL, CKMB, CKMBINDEX, TROPONINI in the last 168 hours. Recent Labs    07/09/20 1426  LABPROT 12.9  INR 1.0   No results for input(s): COLORURINE, LABSPEC, PHURINE, GLUCOSEU, HGBUR, BILIRUBINUR, KETONESUR, PROTEINUR, UROBILINOGEN, NITRITE, LEUKOCYTESUR in the last 72 hours.  Invalid input(s): APPERANCEUR     Component Value Date/Time   CHOL 187 07/10/2020 0141   TRIG 353 (H) 07/10/2020 0141   HDL 41 07/10/2020 0141   CHOLHDL 4.6 07/10/2020 0141   VLDL 71 (H) 07/10/2020 0141   LDLCALC 75 07/10/2020 0141   No results found for: HGBA1C    Component Value Date/Time   LABOPIA NONE DETECTED 07/10/2020 0736   COCAINSCRNUR NONE DETECTED  07/10/2020 0736   LABBENZ NONE DETECTED 07/10/2020 0736   AMPHETMU NONE DETECTED 07/10/2020 0736   THCU NONE DETECTED 07/10/2020 0736   LABBARB NONE DETECTED 07/10/2020 0736    No results for input(s): ETH in the last 168 hours.  I have personally reviewed the radiological images below and agree with the radiology interpretations.  CT HEAD WO CONTRAST  Result Date: 07/09/2020 CLINICAL DATA:  Recent stroke-like symptoms with tPA administration EXAM: CT HEAD WITHOUT CONTRAST TECHNIQUE: Contiguous axial images were obtained from the base of the skull through the vertex without intravenous contrast. COMPARISON:  07/09/2020 FINDINGS: Brain: No evidence of acute infarction, hemorrhage, hydrocephalus, extra-axial collection or mass lesion/mass effect. Scattered chronic white matter ischemic changes are noted. Focal lacunar infarct is noted in the corona radiata on the left stable in appearance from the prior exam. Vascular: No hyperdense vessel or unexpected calcification. Skull: Normal. Negative for fracture or focal lesion. Sinuses/Orbits: No acute finding. Other: None. IMPRESSION: Scattered chronic white matter ischemic changes similar to that seen on the prior exam. No intracranial hemorrhage is identified following tPA administration. Electronically Signed   By: Alcide Clever M.D.   On: 07/09/2020 21:43  CT HEAD CODE STROKE WO CONTRAST  Result Date: 07/09/2020 CLINICAL DATA:  Code stroke.  Left-sided weakness. EXAM: CT HEAD WITHOUT CONTRAST TECHNIQUE: Contiguous axial images were obtained from the base of the skull through the vertex without intravenous contrast. COMPARISON:  None. FINDINGS: Brain: No evidence of acute large vascular territory infarct or acute hemorrhage. Remote appearing left corona radiata infarct. No hydrocephalus, mass lesion, mass effect, or sizable extra-axial fluid collection. Mild patchy white matter hypoattenuation is nonspecific but most likely related to chronic  microvascular ischemic disease. Vascular: No hyperdense vessel identified. Skull: No acute fracture. Sinuses/Orbits: Clear visualized sinuses. Other: No mastoid effusions. ASPECTS [facility] Stroke Program Early CT Score) Total score (0-10 with 10 being normal): 10. IMPRESSION: 1. No evidence of acute large vascular territory infarct or acute hemorrhage. ASPECTS is 10. 2. Remote appearing left corona radiata infarct and mild patchy white matter hypoattenuation, most likely related to chronic microvascular ischemic disease. MRI could provide more sensitive evaluation for acute infarct if clinically indicated. Code stroke imaging results were communicated on 07/09/2020 at 2:52 pm to provider Dr. Amada Jupiter via secure text paging. Electronically Signed   By: Feliberto Harts MD   On: 07/09/2020 14:53   CT ANGIO HEAD NECK W WO CM (CODE STROKE)  Result Date: 07/09/2020 CLINICAL DATA:  Stroke follow-up. EXAM: CT ANGIOGRAPHY HEAD AND NECK TECHNIQUE: Multidetector CT imaging of the head and neck was performed using the standard protocol during bolus administration of intravenous contrast. Multiplanar CT image reconstructions and MIPs were obtained to evaluate the vascular anatomy. Carotid stenosis measurements (when applicable) are obtained utilizing NASCET criteria, using the distal internal carotid diameter as the denominator. CONTRAST:  93mL OMNIPAQUE IOHEXOL 350 MG/ML SOLN COMPARISON:  None. FINDINGS: CTA NECK FINDINGS Aortic arch: Great vessel origins are patent. Right carotid system: No evidence of dissection, stenosis (50% or greater) or occlusion. Left carotid system: No evidence of dissection, stenosis (50% or greater) or occlusion. Vertebral arteries: Left dominant. No evidence of dissection, stenosis (50% or greater) or occlusion. Skeleton: Mild degenerative change. Other neck: No evidence of acute abnormality. Upper chest: Mild dependent ground-glass opacities in the lung apices, likely atelectasis.  Otherwise, visualized lung apices are clear. Review of the MIP images confirms the above findings CTA HEAD FINDINGS Anterior circulation: No large vessel occlusion. Approximately 40-50% left paraclinoid ICA narrowing (series 7, image 25). Late left MCA bifurcation. Bilateral MCAs and ACAs are patent without proximal hemodynamically significant stenosis. No aneurysm. Posterior circulation: Left dominant intradural vertebral artery. No large vessel occlusion or proximal hemodynamically significant stenosis. Mild right P2 PCA narrowing. No aneurysm. Venous sinuses: As permitted by contrast timing, patent. Narrowing of bilateral transverse sinuses. Review of the MIP images confirms the above findings IMPRESSION: CTA Head: 1. No large vessel occlusion. 2. Approximately 40-50% left paraclinoid ICA narrowing. CTA Neck: No significant (greater than 50%) stenosis. Electronically Signed   By: Feliberto Harts MD   On: 07/09/2020 16:23    PHYSICAL EXAM  Temp:  [97.9 F (36.6 C)-98.3 F (36.8 C)] 98.3 F (36.8 C) (05/30 0800) Pulse Rate:  [95-107] 102 (05/30 0930) Resp:  [12-44] 15 (05/30 0930) BP: (129-210)/(85-167) 152/113 (05/30 0930) SpO2:  [88 %-100 %] 100 % (05/30 0930) FiO2 (%):  [31 %] 31 % (05/29 2332) Weight:  [86.8 kg-87.1 kg] 87.1 kg (05/29 1545)  General - Well nourished, well developed, in no apparent distress.  Ophthalmologic - fundi not visualized due to noncooperation.  Cardiovascular - Regular rhythm and intermittent tachycardia.  Mental Status -  Level of arousal and orientation to time, place, and person were intact. Language including expression, naming, repetition, comprehension was assessed and found intact. Attention span and concentration were normal. Fund of Knowledge was assessed and was intact.  Cranial Nerves II - XII - II - Visual field intact OU. III, IV, VI - Extraocular movements intact. V - Facial sensation intact bilaterally. VII - Facial movement intact  bilaterally. VIII - Hearing & vestibular intact bilaterally. X - Palate elevates symmetrically. XI - Chin turning & shoulder shrug intact bilaterally. XII - Tongue protrusion intact.  Motor Strength - The patient's strength was normal in all extremities and pronator drift was absent.  Bulk was normal and fasciculations were absent.   Motor Tone - Muscle tone was assessed at the neck and appendages and was normal.  Reflexes - The patient's reflexes were symmetrical in all extremities and she had no pathological reflexes.  Sensory - Light touch, temperature/pinprick were assessed and were symmetrical.    Coordination - The patient had normal movements in the hands and feet with no ataxia or dysmetria.  Tremor was absent.  Gait and Station - deferred.   ASSESSMENT/PLAN Ms. Jenette Rayson is a 40 y.o. female with history of HTN, Crohn's disease admitted for left sided weakness and numbness. TPA given.    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 Home med - norvasc 5, labetalol 800 tid, HCTZ and spironolactone . Unstable . BP goal < 180/105 after tPA . Resume home norvasc 10 . Put on metoprolol 50mg  Q8h as pt stated that labetalol not effective and she does have borderline tachycardia . Renal artery pending   Long term BP goal normotensive  Hyperlipidemia  Home meds:  none   LDL 75, TG 353, goal < 70  Now on lipitor 40 and zetia 10  Continue statin at discharge  Other Stroke Risk Factors  Obesity, Body  mass index is 32.96 kg/m.   Other Active Problems  Crohn's disease  Hx of psorasis   Hospital day # 1  This patient is critically ill due to stroke symptoms s/p tPA, hypertensive emergency and at significant risk of neurological worsening, death form bleeding from tPA, hemorrhagic conversion, recurrent stroke. This patient's care requires constant monitoring of vital signs, hemodynamics, respiratory and cardiac monitoring, review of multiple databases, neurological assessment, discussion with family, other specialists and medical decision making of high complexity. I spent 40 minutes of neurocritical care time in the care of this patient.  [place], MD PhD Stroke Neurology 07/10/2020 9:44 AM    To contact Stroke Continuity provider, please refer to 07/12/2020. After hours, contact General Neurology

## 2020-07-10 NOTE — Progress Notes (Signed)
OT Cancellation Note  Patient Details Name: Tanya Hamilton MRN: [id number] DOB: 24-Mar-1980   Cancelled Treatment:    Reason Eval/Treat Not Completed: Active bedrest order OT order received and appreciated however this conflicts with current bedrest order set. Please increase activity tolerance as appropriate and remove bedrest from orders. . Please contact OT at [phone] if bed rest order is discontinued. OT will hold evaluation at this time and will check back as time allows pending increased activity orders.   Wynona Neat, OTR/L  Acute Rehabilitation Services Pager: [phone] Office: [phone] .  07/10/2020, 7:40 AM

## 2020-07-11 ENCOUNTER — Inpatient Hospital Stay (HOSPITAL_COMMUNITY): Payer: Medicaid Other

## 2020-07-11 DIAGNOSIS — Z9282 Status post administration of tPA (rtPA) in a different facility within the last 24 hours prior to admission to current facility: Secondary | ICD-10-CM

## 2020-07-11 DIAGNOSIS — I6389 Other cerebral infarction: Secondary | ICD-10-CM

## 2020-07-11 DIAGNOSIS — G459 Transient cerebral ischemic attack, unspecified: Secondary | ICD-10-CM | POA: Diagnosis not present

## 2020-07-11 DIAGNOSIS — I160 Hypertensive urgency: Secondary | ICD-10-CM | POA: Diagnosis not present

## 2020-07-11 DIAGNOSIS — I161 Hypertensive emergency: Secondary | ICD-10-CM | POA: Diagnosis not present

## 2020-07-11 LAB — ECHOCARDIOGRAM COMPLETE
AR max vel: 2.47 cm2
AV Area VTI: 2.63 cm2
AV Area mean vel: 2.47 cm2
AV Mean grad: 3 mmHg
AV Peak grad: 5.6 mmHg
Ao pk vel: 1.18 m/s
Area-P 1/2: 4.63 cm2
Height: 64 in
S' Lateral: 2.5 cm
Weight: 3072.33 oz

## 2020-07-11 LAB — HEMOGLOBIN A1C
Hgb A1c MFr Bld: 6 % — ABNORMAL HIGH (ref 4.8–5.6)
Mean Plasma Glucose: 126 mg/dL

## 2020-07-11 MED ORDER — ASPIRIN EC 81 MG PO TBEC
81.0000 mg | DELAYED_RELEASE_TABLET | Freq: Every day | ORAL | Status: DC
  Administered 2020-07-11 – 2020-07-12 (×2): 81 mg via ORAL
  Filled 2020-07-11 (×2): qty 1

## 2020-07-11 MED ORDER — LABETALOL HCL 5 MG/ML IV SOLN: 5.0000 mg | INTRAVENOUS | Status: DC | PRN

## 2020-07-11 MED ORDER — CLOPIDOGREL BISULFATE 75 MG PO TABS
75.0000 mg | ORAL_TABLET | Freq: Every day | ORAL | Status: DC
  Administered 2020-07-11 – 2020-07-12 (×2): 75 mg via ORAL
  Filled 2020-07-11 (×2): qty 1

## 2020-07-11 MED ORDER — LOSARTAN POTASSIUM 50 MG PO TABS
50.0000 mg | ORAL_TABLET | Freq: Two times a day (BID) | ORAL | Status: DC
  Administered 2020-07-11 – 2020-07-12 (×2): 50 mg via ORAL
  Filled 2020-07-11 (×2): qty 1

## 2020-07-11 NOTE — Progress Notes (Addendum)
Physical Therapy Treatment Patient Details Name: Tanya Hamilton MRN: [id number] DOB: 1980-10-13 Today's Date: 07/11/2020    History of Present Illness The pt is a 40 yo female presenting 5/29 with sudden onset L-sided weakness and numbness. Pt hypertensive upon arrival, received tPA on 5/29. MRI pending. PMH includes: HTN, Crohn's disease.    PT Comments    Pt mobilizing at a mod I level today, only requires close supervision when PT challenging pt's dynamic standing balance or during step navigation. Pt ambulated 500+ ft today with no AD or evidence of instability, pt reports feeling back to baseline at this time. PT reviewed "BE FAST" signs and symptoms of CVA, pt expresses understanding.    Follow Up Recommendations  No PT follow up     Equipment Recommendations  None recommended by PT    Recommendations for Other Services       Precautions / Restrictions Precautions Precautions: Other (comment) Precaution Comments: watch BP Restrictions Weight Bearing Restrictions: No    Mobility  Bed Mobility Overal bed mobility: Modified Independent Bed Mobility: Supine to Sit;Sit to Supine     Supine to sit: Modified independent (Device/Increase time) Sit to supine: Modified independent (Device/Increase time)        Transfers Overall transfer level: Independent Equipment used: None             General transfer comment: STS performed without difficulty  Ambulation/Gait Ambulation/Gait assistance: Modified independent (Device/Increase time) Gait Distance (Feet): 500 Feet Assistive device: None Gait Pattern/deviations: WFL(Within Functional Limits);Step-through pattern Gait velocity: WFL   General Gait Details: WFL gait, cues for increasing speed achieved with difficulty.   Stairs Stairs: Yes Stairs assistance: Supervision Stair Management: One rail Left;Forwards Number of Stairs: 24 General stair comments: supervision for safety, performed x2 small flights to  simulate entry into home. Increased time to perform, HR max 106 bpm   Wheelchair Mobility    Modified Rankin (Stroke Patients Only) Modified Rankin (Stroke Patients Only) Pre-Morbid Rankin Score: No symptoms Modified Rankin: Slight disability     Balance Overall balance assessment: Mild deficits observed, not formally tested   Sitting balance-Leahy Scale: Normal     Standing balance support: No upper extremity supported Standing balance-Leahy Scale: Good                   Standardized Balance Assessment Standardized Balance Assessment : Dynamic Gait Index   Dynamic Gait Index Level Surface: Normal Change in Gait Speed: Normal Gait with Horizontal Head Turns: Normal Gait with Vertical Head Turns: Normal Gait and Pivot Turn: Normal Step Over Obstacle: Mild Impairment Step Around Obstacles: Normal Steps: Mild Impairment Total Score: 22      Cognition Arousal/Alertness: Awake/alert Behavior During Therapy: WFL for tasks assessed/performed Overall Cognitive Status: Within Functional Limits for tasks assessed                                        Exercises      General Comments        Pertinent Vitals/Pain Pain Assessment: No/denies pain    Home Living                      Prior Function            PT Goals (current goals can now be found in the care plan section) Acute Rehab PT Goals Patient Stated Goal: to return home PT  Goal Formulation: With patient Time For Goal Achievement: 07/24/20 Potential to Achieve Goals: Good Progress towards PT goals: Progressing toward goals    Frequency    Min 4X/week      PT Plan Current plan remains appropriate    Co-evaluation              AM-PAC PT "6 Clicks" Mobility   Outcome Measure  Help needed turning from your back to your side while in a flat bed without using bedrails?: None Help needed moving from lying on your back to sitting on the side of a flat bed  without using bedrails?: None Help needed moving to and from a bed to a chair (including a wheelchair)?: None Help needed standing up from a chair using your arms (e.g., wheelchair or bedside chair)?: None Help needed to walk in hospital room?: None Help needed climbing 3-5 steps with a railing? : A Little 6 Click Score: 23    End of Session   Activity Tolerance: Patient tolerated treatment well Patient left: in bed;with call bell/phone within reach Nurse Communication: Mobility status PT Visit Diagnosis: Unsteadiness on feet (R26.81);Other abnormalities of gait and mobility (R26.89)     Time: 6294-7654 PT Time Calculation (min) (ACUTE ONLY): 16 min  Charges:  $Gait Training: 8-22 mins                     Marye Round, PT DPT Acute Rehabilitation Services Pager [phone]  Office [phone]    Tanya Hamilton 07/11/2020, 1:56 PM

## 2020-07-11 NOTE — Progress Notes (Signed)
Renal duplex has been completed.  Results can be found under chart review under CV PROC. 07/11/2020 12:40 PM Millette Halberstam RVT, RDMS

## 2020-07-11 NOTE — Progress Notes (Signed)
Patient becoming increasingly agitated and wanting to leave the hospital. Educated patient on importance of staying for entire admission and not leaving against medical advice.   Patient agreed to stay overnight but would be leaving tomorrow.

## 2020-07-11 NOTE — Progress Notes (Signed)
  Echocardiogram 2D Echocardiogram has been performed.  Tanya Hamilton F 07/11/2020, 1:52 PM

## 2020-07-11 NOTE — Progress Notes (Signed)
STROKE TEAM PROGRESS NOTE   SUBJECTIVE (INTERVAL HISTORY) Her RN is at the bedside.  Overall her condition is completely resolved.  Off Cleviprex, on p.o. BP meds, BP stable.  Had renal artery ultrasound, no renal artery stenosis bilaterally.   OBJECTIVE Temp:  [97.5 F (36.4 C)-98.4 F (36.9 C)] 97.5 F (36.4 C) (05/31 1202) Pulse Rate:  [80-117] 86 (05/31 1345) Cardiac Rhythm: Normal sinus rhythm (05/31 0800) Resp:  [13-38] 14 (05/31 1345) BP: (104-162)/(70-121) 144/98 (05/31 1345) SpO2:  [93 %-100 %] 99 % (05/31 1345)  Recent Labs  Lab 07/09/20 1427  GLUCAP 77   Recent Labs  Lab 07/09/20 1426 07/09/20 1431 07/10/20 0800  NA 135 135 133*  K 3.8 3.6 3.7  CL 105 107 104  CO2 19*  --  20*  GLUCOSE 77 79 125*  BUN 20 24* 12  CREATININE 1.49* 1.50* 1.07*  CALCIUM 8.9  --  8.6*   Recent Labs  Lab 07/09/20 1426  AST 21  ALT 13  ALKPHOS 52  BILITOT 0.5  PROT 8.0  ALBUMIN 3.3*   Recent Labs  Lab 07/09/20 1426 07/09/20 1431 07/10/20 0800  WBC 5.6  --  6.8  NEUTROABS 2.6  --   --   HGB 12.9 13.9 13.5  HCT 41.4 41.0 41.1  MCV 82.6  --  78.3*  PLT 282  --  361   No results for input(s): CKTOTAL, CKMB, CKMBINDEX, TROPONINI in the last 168 hours. Recent Labs    07/09/20 1426  LABPROT 12.9  INR 1.0   No results for input(s): COLORURINE, LABSPEC, PHURINE, GLUCOSEU, HGBUR, BILIRUBINUR, KETONESUR, PROTEINUR, UROBILINOGEN, NITRITE, LEUKOCYTESUR in the last 72 hours.  Invalid input(s): APPERANCEUR     Component Value Date/Time   CHOL 187 07/10/2020 0141   TRIG 353 (H) 07/10/2020 0141   HDL 41 07/10/2020 0141   CHOLHDL 4.6 07/10/2020 0141   VLDL 71 (H) 07/10/2020 0141   LDLCALC 75 07/10/2020 0141   Lab Results  Component Value Date   HGBA1C 6.0 (H) 07/10/2020      Component Value Date/Time   LABOPIA NONE DETECTED 07/10/2020 0736   COCAINSCRNUR NONE DETECTED 07/10/2020 0736   LABBENZ NONE DETECTED 07/10/2020 0736   AMPHETMU NONE DETECTED 07/10/2020  0736   THCU NONE DETECTED 07/10/2020 0736   LABBARB NONE DETECTED 07/10/2020 0736    No results for input(s): ETH in the last 168 hours.  I have personally reviewed the radiological images below and agree with the radiology interpretations.  CT HEAD WO CONTRAST  Result Date: 07/09/2020 CLINICAL DATA:  Recent stroke-like symptoms with tPA administration EXAM: CT HEAD WITHOUT CONTRAST TECHNIQUE: Contiguous axial images were obtained from the base of the skull through the vertex without intravenous contrast. COMPARISON:  07/09/2020 FINDINGS: Brain: No evidence of acute infarction, hemorrhage, hydrocephalus, extra-axial collection or mass lesion/mass effect. Scattered chronic white matter ischemic changes are noted. Focal lacunar infarct is noted in the corona radiata on the left stable in appearance from the prior exam. Vascular: No hyperdense vessel or unexpected calcification. Skull: Normal. Negative for fracture or focal lesion. Sinuses/Orbits: No acute finding. Other: None. IMPRESSION: Scattered chronic white matter ischemic changes similar to that seen on the prior exam. No intracranial hemorrhage is identified following tPA administration. Electronically Signed   By: Alcide Clever M.D.   On: 07/09/2020 21:43   MR BRAIN WO CONTRAST  Result Date: 07/10/2020 CLINICAL DATA:  Status post tPA.  Left-sided weakness. EXAM: MRI HEAD WITHOUT CONTRAST TECHNIQUE: Multiplanar, multiecho  pulse sequences of the brain and surrounding structures were obtained without intravenous contrast. COMPARISON:  CT exams from Jul 09, 2020. FINDINGS: Brain: No acute infarction, hemorrhage, hydrocephalus, extra-axial collection or mass lesion. Remote infarct in the left corona radiata. Additional mild-to-moderate, age advanced T2/FLAIR hyperintensities within the white matter and pons. Vascular: Major arterial flow voids are maintained at the skull base. Skull and upper cervical spine: Normal marrow signal. Sinuses/Orbits: Clear  sinuses.  Unremarkable orbits. Other: Trace bilateral mastoid fluid. IMPRESSION: 1. No evidence of acute intracranial abnormality. Specifically, no acute infarct. 2. Remote left corona radiata lacunar infarct. 3. Age-advanced mild-to-moderate scattered T2/FLAIR hyperintensities, which are nonspecific but most likely related to chronic microvascular ischemic disease given the patient's risk factors (including hypertension and a hyperlipidemia). Electronically Signed   By: Feliberto Harts MD   On: 07/10/2020 14:31   CT HEAD CODE STROKE WO CONTRAST  Result Date: 07/09/2020 CLINICAL DATA:  Code stroke.  Left-sided weakness. EXAM: CT HEAD WITHOUT CONTRAST TECHNIQUE: Contiguous axial images were obtained from the base of the skull through the vertex without intravenous contrast. COMPARISON:  None. FINDINGS: Brain: No evidence of acute large vascular territory infarct or acute hemorrhage. Remote appearing left corona radiata infarct. No hydrocephalus, mass lesion, mass effect, or sizable extra-axial fluid collection. Mild patchy white matter hypoattenuation is nonspecific but most likely related to chronic microvascular ischemic disease. Vascular: No hyperdense vessel identified. Skull: No acute fracture. Sinuses/Orbits: Clear visualized sinuses. Other: No mastoid effusions. ASPECTS [facility] Stroke Program Early CT Score) Total score (0-10 with 10 being normal): 10. IMPRESSION: 1. No evidence of acute large vascular territory infarct or acute hemorrhage. ASPECTS is 10. 2. Remote appearing left corona radiata infarct and mild patchy white matter hypoattenuation, most likely related to chronic microvascular ischemic disease. MRI could provide more sensitive evaluation for acute infarct if clinically indicated. Code stroke imaging results were communicated on 07/09/2020 at 2:52 pm to provider Dr. Amada Jupiter via secure text paging. Electronically Signed   By: Feliberto Harts MD   On: 07/09/2020 14:53   VAS US RENAL  ARTERY DUPLEX  Result Date: 07/11/2020 ABDOMINAL VISCERAL Patient Name:  RAQUELLE PIETRO  Date of Exam:   07/11/2020 Medical Rec #: [id number]      Accession #:    [id number] Date of Birth: 04/01/1980      Patient Gender: F Patient Age:   65Y Exam Location:  [facility] Procedure:      VAS US RENAL ARTERY DUPLEX Referring Phys: [id number] Zaelyn Noack -------------------------------------------------------------------------------- High Risk Factors: Hypertension, current smoker, prior CVA. Limitations: Patient uncooperative with positioning. Performing Technologist: Ernestene Mention  Examination Guidelines: A complete evaluation includes B-mode imaging, spectral Doppler, color Doppler, and power Doppler as needed of all accessible portions of each vessel. Bilateral testing is considered an integral part of a complete examination. Limited examinations for reoccurring indications may be performed as noted.  Duplex Findings: +--------------------+--------+--------+------+--------+ Mesenteric          PSV cm/sEDV cm/sPlaqueComments +--------------------+--------+--------+------+--------+ Aorta Mid             108      17                  +--------------------+--------+--------+------+--------+ Celiac Artery Origin  125      30                  +--------------------+--------+--------+------+--------+ SMA Proximal          143      11                  +--------------------+--------+--------+------+--------+    +------------------+--------+--------+-------+  Right Renal ArteryPSV cm/sEDV cm/sComment +------------------+--------+--------+-------+ Origin               47      15           +------------------+--------+--------+-------+ Proximal             42      18           +------------------+--------+--------+-------+ Mid                  46      14           +------------------+--------+--------+-------+ Distal               42      16            +------------------+--------+--------+-------+ +-----------------+--------+--------+-------+ Left Renal ArteryPSV cm/sEDV cm/sComment +-----------------+--------+--------+-------+ Origin              55      19           +-----------------+--------+--------+-------+ Proximal            67      24           +-----------------+--------+--------+-------+ Mid                 62      21           +-----------------+--------+--------+-------+ Distal              41      15           +-----------------+--------+--------+-------+ +------------+--------+--------+----+-----------+--------+--------+----+ Right KidneyPSV cm/sEDV cm/sRI  Left KidneyPSV cm/sEDV cm/sRI   +------------+--------+--------+----+-----------+--------+--------+----+ Upper Pole  21      8       0.62Upper Pole 26      9       0.64 +------------+--------+--------+----+-----------+--------+--------+----+ Mid         28      12      0.        20      8       0.60 +------------+--------+--------+----+-----------+--------+--------+----+ Lower Pole  22      10      0.47Lower Pole 15      7       0.56 +------------+--------+--------+----+-----------+--------+--------+----+ Hilar       42      13      0.68Hilar      38      14      0.63 +------------+--------+--------+----+-----------+--------+--------+----+ +------------------+----+------------------+----+ Right Kidney          Left Kidney            +------------------+----+------------------+----+ RAR                   RAR                    +------------------+----+------------------+----+ RAR (manual)          RAR (manual)           +------------------+----+------------------+----+ Cortex            0.59Cortex            0.50 +------------------+----+------------------+----+ Cortex thickness      Corex thickness        +------------------+----+------------------+----+ Kidney length (cm)9.71Kidney length  (cm)9.99 +------------------+----+------------------+----+  Summary: Renal:  Right: Normal size right kidney. Normal right Resisitive Index. No        evidence of  right renal artery stenosis. Left:  Normal size of left kidney. Normal left Resistive Index. No        evidence of left renal artery stenosis. Mesenteric: Normal Celiac artery and Superior Mesenteric artery findings. Areas of limited visceral study include left kidney size.  *See table(s) above for measurements and observations.  Diagnosing physician: Waverly Ferrari MD  Electronically signed by Waverly Ferrari MD on 07/11/2020 at 1:11:27 PM.    Final    CT ANGIO HEAD NECK W WO CM (CODE STROKE)  Result Date: 07/09/2020 CLINICAL DATA:  Stroke follow-up. EXAM: CT ANGIOGRAPHY HEAD AND NECK TECHNIQUE: Multidetector CT imaging of the head and neck was performed using the standard protocol during bolus administration of intravenous contrast. Multiplanar CT image reconstructions and MIPs were obtained to evaluate the vascular anatomy. Carotid stenosis measurements (when applicable) are obtained utilizing NASCET criteria, using the distal internal carotid diameter as the denominator. CONTRAST:  49mL OMNIPAQUE IOHEXOL 350 MG/ML SOLN COMPARISON:  None. FINDINGS: CTA NECK FINDINGS Aortic arch: Great vessel origins are patent. Right carotid system: No evidence of dissection, stenosis (50% or greater) or occlusion. Left carotid system: No evidence of dissection, stenosis (50% or greater) or occlusion. Vertebral arteries: Left dominant. No evidence of dissection, stenosis (50% or greater) or occlusion. Skeleton: Mild degenerative change. Other neck: No evidence of acute abnormality. Upper chest: Mild dependent ground-glass opacities in the lung apices, likely atelectasis. Otherwise, visualized lung apices are clear. Review of the MIP images confirms the above findings CTA HEAD FINDINGS Anterior circulation: No large vessel occlusion. Approximately 40-50%  left paraclinoid ICA narrowing (series 7, image 25). Late left MCA bifurcation. Bilateral MCAs and ACAs are patent without proximal hemodynamically significant stenosis. No aneurysm. Posterior circulation: Left dominant intradural vertebral artery. No large vessel occlusion or proximal hemodynamically significant stenosis. Mild right P2 PCA narrowing. No aneurysm. Venous sinuses: As permitted by contrast timing, patent. Narrowing of bilateral transverse sinuses. Review of the MIP images confirms the above findings IMPRESSION: CTA Head: 1. No large vessel occlusion. 2. Approximately 40-50% left paraclinoid ICA narrowing. CTA Neck: No significant (greater than 50%) stenosis. Electronically Signed   By: Feliberto Harts MD   On: 07/09/2020 16:23    PHYSICAL EXAM  Temp:  [97.5 F (36.4 C)-98.4 F (36.9 C)] 97.5 F (36.4 C) (05/31 1202) Pulse Rate:  [80-117] 86 (05/31 1345) Resp:  [13-38] 14 (05/31 1345) BP: (104-162)/(70-121) 144/98 (05/31 1345) SpO2:  [93 %-100 %] 99 % (05/31 1345)  General - Well nourished, well developed, in no apparent distress.  Ophthalmologic - fundi not visualized due to noncooperation.  Cardiovascular - Regular rhythm and intermittent tachycardia.  Mental Status -  Level of arousal and orientation to time, place, and person were intact. Language including expression, naming, repetition, comprehension was assessed and found intact. Attention span and concentration were normal. Fund of Knowledge was assessed and was intact.  Cranial Nerves II - XII - II - Visual field intact OU. III, IV, VI - Extraocular movements intact. V - Facial sensation intact bilaterally. VII - Facial movement intact bilaterally. VIII - Hearing & vestibular intact bilaterally. X - Palate elevates symmetrically. XI - Chin turning & shoulder shrug intact bilaterally. XII - Tongue protrusion intact.  Motor Strength - The patient's strength was normal in all extremities and pronator drift  was absent.  Bulk was normal and fasciculations were absent.   Motor Tone - Muscle tone was assessed at the neck and appendages and was normal.  Reflexes - The patient's  reflexes were symmetrical in all extremities and she had no pathological reflexes.  Sensory - Light touch, temperature/pinprick were assessed and were symmetrical.    Coordination - The patient had normal movements in the hands and feet with no ataxia or dysmetria.  Tremor was absent.  Gait and Station - deferred.   ASSESSMENT/PLAN Ms. Lindaann PascalDiane Hebb is a 40 y.o. female with history of HTN, Crohn's disease admitted for left sided weakness and numbness. TPA given.    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  Ongoing aggressive stroke risk factor management  Therapy recommendations: None  Disposition:  Pending   Hypertensive emergency . Home med - norvasc 5, labetalol 800 tid, HCTZ and spironolactone . BP stable now . Off Cleviprex . Resume home norvasc 10 . Put on metoprolol 50mg  Q8h as pt stated that labetalol not effective and she does have borderline tachycardia . May consider at ACEI or ARBs . Renal artery US negative for renal artery stenosis  Long term BP goal normotensive  Hyperlipidemia  Home meds:  none   LDL 75, TG 353, goal < 70  Now on lipitor 40 and zetia 10  Continue statin at discharge  Other Stroke Risk Factors  Obesity, Body mass index is 32.96 kg/m.   Other Active Problems  Crohn's disease  Hx of psorasis   Hospital day # 2  This patient is critically ill due to  status post tPA, hypertensive emergency and at significant risk of neurological worsening, death form bleeding from tPA, hypertensive encephalopathy, seizure. This patient's care requires constant monitoring of vital signs, hemodynamics, respiratory and cardiac monitoring, review of multiple databases, neurological assessment, discussion with family, other specialists and medical decision making of high complexity. I spent 30 minutes of neurocritical care time in the care of this patient.   Marvel PlanJindong India Jolin, MD PhD Stroke Neurology 07/11/2020 3:59 PM    To contact Stroke Continuity provider, please refer to [email or web address]. After hours, contact General Neurology

## 2020-07-11 NOTE — TOC Initial Note (Addendum)
Transition of Care [facility]) - Initial/Assessment Note    Patient Details  Name: Tanya Hamilton MRN: [id number] Date of Birth: December 10, 1980  Transition [facility] CM/SW Contact:    Glennon Mac, RN Phone Number: 07/11/2020, 1:50 PM  Clinical Narrative:  The pt is a 40 yo female presenting 5/29 with sudden onset L-sided weakness and numbness. Pt hypertensive upon arrival, received tPA on 5/29.  PTA, pt independent and living at home with sister in law, her 40 yo son, and 2 nieces; family and BF able to provide 24h assistance at dc.   PT/OT recommending no OP follow up.  Pt is new to [place]; she moved here from [place] about 3 months ago, and has not established a PCP.  Spoke with pt regarding PCP; she is agreeable to appointment at [facility] for PCP/hospital follow up. Pt has established [place] Medicaid, and will be able to get meds filled with minimal copay.  Will arrange follow up appointment.                       Expected Discharge Plan: Home/Self Care Barriers to Discharge: Continued Medical Work up   Patient Goals and CMS Choice Patient states their goals for this hospitalization and ongoing recovery are:: to go home      Expected Discharge Plan and Services Expected Discharge Plan: Home/Self Care   Discharge Planning Services: CM Consult,Follow-up appt scheduled   Living arrangements for the past 2 months: Apartment                                      Prior Living Arrangements/Services Living arrangements for the past 2 months: Apartment Lives with:: Relatives Patient language and need for interpreter reviewed:: Yes Do you feel safe going back to the place where you live?: Yes      Need for Family Participation in Patient Care: Yes (Comment) Care giver support system in place?: Yes (comment)   Criminal Activity/Legal Involvement Pertinent to Current Situation/Hospitalization: No - Comment as needed  Activities of Daily Living Home  Assistive Devices/Equipment: None ADL Screening (condition at time of admission) Patient's cognitive ability adequate to safely complete daily activities?: Yes Is the patient deaf or have difficulty hearing?: No Does the patient have difficulty seeing, even when wearing glasses/contacts?: No Does the patient have difficulty concentrating, remembering, or making decisions?: No Patient able to express need for assistance with ADLs?: Yes Does the patient have difficulty dressing or bathing?: No Independently performs ADLs?: Yes (appropriate for developmental age) Does the patient have difficulty walking or climbing stairs?: No Weakness of Legs: Left Weakness of Arms/Hands: None  Permission Sought/Granted                  Emotional Assessment Appearance:: Appears stated age Attitude/Demeanor/Rapport: Engaged Affect (typically observed): Accepting Orientation: : Oriented to Self,Oriented to Place,Oriented to  Time,Oriented to Situation      Admission diagnosis:  Stroke (cerebrum) [facility]) [I63.9] Patient Active Problem List   Diagnosis Date Noted  . 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/09/2020   PCP:  Oneita Hurt, No Pharmacy:   [facility] [phone] - [place], [place] - [phone] [facility] [address] [place] [address] Phone: [phone] Fax: [phone]     Social Determinants of Health (SDOH) Interventions    Readmission Risk Interventions Readmission Risk Prevention  Plan 07/11/2020  Post Dischage Appt Complete  Medication Screening Complete  Transportation Screening Complete   Quintella Baton, RN, BSN  Trauma/Neuro ICU Case Manager [phone]

## 2020-07-12 DIAGNOSIS — E782 Mixed hyperlipidemia: Secondary | ICD-10-CM | POA: Diagnosis not present

## 2020-07-12 DIAGNOSIS — G459 Transient cerebral ischemic attack, unspecified: Secondary | ICD-10-CM | POA: Diagnosis not present

## 2020-07-12 DIAGNOSIS — I161 Hypertensive emergency: Secondary | ICD-10-CM | POA: Diagnosis not present

## 2020-07-12 MED ORDER — CLOPIDOGREL BISULFATE 75 MG PO TABS: 75.0000 mg | ORAL_TABLET | Freq: Every day | ORAL | 0 refills | Status: AC

## 2020-07-12 MED ORDER — ASPIRIN 81 MG PO TBEC: 81.0000 mg | DELAYED_RELEASE_TABLET | Freq: Every day | ORAL | 11 refills | Status: AC

## 2020-07-12 MED ORDER — AMLODIPINE BESYLATE 10 MG PO TABS: 10.0000 mg | ORAL_TABLET | Freq: Every morning | ORAL | 1 refills | Status: AC

## 2020-07-12 MED ORDER — EZETIMIBE 10 MG PO TABS: 10.0000 mg | ORAL_TABLET | Freq: Every day | ORAL | 1 refills | Status: AC

## 2020-07-12 MED ORDER — ATORVASTATIN CALCIUM 40 MG PO TABS: 40.0000 mg | ORAL_TABLET | Freq: Every day | ORAL | 1 refills | Status: AC

## 2020-07-12 MED ORDER — LOSARTAN POTASSIUM 50 MG PO TABS: 50.0000 mg | ORAL_TABLET | Freq: Two times a day (BID) | ORAL | 0 refills | Status: AC

## 2020-07-12 MED ORDER — METOPROLOL TARTRATE 50 MG PO TABS: 75.0000 mg | ORAL_TABLET | Freq: Two times a day (BID) | ORAL | 1 refills | Status: AC

## 2020-07-12 NOTE — Discharge Summary (Addendum)
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 Uncontrolled HTN 3. Obesity  Allergies as of 07/12/2020      Reactions   Other Anaphylaxis   Reaction to tree nuts   Peanut-containing Drug Products Anaphylaxis   Hydralazine Other (See Comments)   tachycardia   Lisinopril Swelling   Lip and leg swelling      Medication List    STOP taking these medications   hydrochlorothiazide 25 MG tablet Commonly known as: HYDRODIURIL   labetalol 200 MG tablet Commonly known as: NORMODYNE   spironolactone 25 MG tablet Commonly known as: ALDACTONE     TAKE these medications   amLODipine 10 MG tablet Commonly known as: NORVASC Take 1 tablet (10 mg total) by mouth every morning. What changed:   medication strength  how much to take   aspirin 81 MG EC tablet Take 1 tablet (81 mg total) by mouth daily. Swallow whole. Start taking on: July 13, 2020   atorvastatin 40 MG tablet Commonly known as: LIPITOR Take 1 tablet (40 mg total) by mouth daily. Start taking on: July 13, 2020   clopidogrel 75 MG tablet Commonly known as: PLAVIX Take 1 tablet (75 mg total) by mouth daily for 21 days. Start taking on: August 03, 2020   ezetimibe 10 MG tablet Commonly known as: ZETIA Take 1 tablet (10 mg total) by mouth daily. Start taking on: July 13, 2020   losartan 50 MG tablet Commonly known as: COZAAR Take 1 tablet (50 mg total) by mouth 2 (two) times daily.   metoprolol tartrate 50 MG tablet Commonly known as: LOPRESSOR Take 1.5 tablets (75 mg total) by mouth 2 (two) times daily.   multivitamin with minerals Tabs tablet Take 1  tablet by mouth at bedtime.   triamcinolone ointment 0.1 % Commonly known as: KENALOG Apply 1 application topically 2 (two) times daily. For plaque psoriasis       LABORATORY STUDIES CBC    Component Value Date/Time   WBC 6.8 07/10/2020 0800   RBC 5.25 (H) 07/10/2020 0800   HGB 13.5 07/10/2020 0800   HCT 41.1 07/10/2020 0800   PLT 361 07/10/2020 0800   MCV 78.3 (L) 07/10/2020 0800   MCH 25.7 (L) 07/10/2020 0800   MCHC 32.8 07/10/2020 0800   RDW 16.0 (H) 07/10/2020 0800   LYMPHSABS 2.1 07/09/2020 1426   MONOABS 0.6 07/09/2020 1426   EOSABS 0.2 07/09/2020 1426   BASOSABS 0.0 07/09/2020 1426   CMP    Component Value Date/Time   NA 133 (L) 07/10/2020 0800   K 3.7 07/10/2020 0800   CL 104 07/10/2020 0800   CO2 20 (L) 07/10/2020 0800   GLUCOSE 125 (H) 07/10/2020 0800   BUN 12 07/10/2020 0800   CREATININE 1.07 (H) 07/10/2020 0800   CALCIUM 8.6 (L) 07/10/2020 0800   PROT 8.0 07/09/2020 1426   ALBUMIN 3.3 (L) 07/09/2020 1426   AST 21 07/09/2020 1426   ALT 13 07/09/2020 1426   ALKPHOS 52 07/09/2020 1426   BILITOT 0.5 07/09/2020 1426   GFRNONAA >60 07/10/2020 0800   COAGS Lab Results  Component Value Date  INR 1.0 07/09/2020   Lipid Panel    Component Value Date/Time   CHOL 187 07/10/2020 0141   TRIG 353 (H) 07/10/2020 0141   HDL 41 07/10/2020 0141   CHOLHDL 4.6 07/10/2020 0141   VLDL 71 (H) 07/10/2020 0141   LDLCALC 75 07/10/2020 0141   HgbA1C  Lab Results  Component Value Date   HGBA1C 6.0 (H) 07/10/2020   Urinalysis No results found for: COLORURINE, APPEARANCEUR, LABSPEC, PHURINE, GLUCOSEU, HGBUR, BILIRUBINUR, KETONESUR, PROTEINUR, UROBILINOGEN, NITRITE, LEUKOCYTESUR Urine Drug Screen     Component Value Date/Time   LABOPIA NONE DETECTED 07/10/2020 0736   COCAINSCRNUR NONE DETECTED 07/10/2020 0736   LABBENZ NONE DETECTED 07/10/2020 0736   AMPHETMU NONE DETECTED 07/10/2020 0736   THCU NONE DETECTED 07/10/2020 0736   LABBARB NONE DETECTED 07/10/2020  0736    Alcohol Level No results found for: ETH   SIGNIFICANT DIAGNOSTIC STUDIES CT HEAD WO CONTRAST  Result Date: 07/09/2020 CLINICAL DATA:  Recent stroke-like symptoms with tPA administration EXAM: CT HEAD WITHOUT CONTRAST TECHNIQUE: Contiguous axial images were obtained from the base of the skull through the vertex without intravenous contrast. COMPARISON:  07/09/2020 FINDINGS: Brain: No evidence of acute infarction, hemorrhage, hydrocephalus, extra-axial collection or mass lesion/mass effect. Scattered chronic white matter ischemic changes are noted. Focal lacunar infarct is noted in the corona radiata on the left stable in appearance from the prior exam. Vascular: No hyperdense vessel or unexpected calcification. Skull: Normal. Negative for fracture or focal lesion. Sinuses/Orbits: No acute finding. Other: None. IMPRESSION: Scattered chronic white matter ischemic changes similar to that seen on the prior exam. No intracranial hemorrhage is identified following tPA administration. Electronically Signed   By: Alcide Clever M.D.   On: 07/09/2020 21:43   MR BRAIN WO CONTRAST  Result Date: 07/10/2020 CLINICAL DATA:  Status post tPA.  Left-sided weakness. EXAM: MRI HEAD WITHOUT CONTRAST TECHNIQUE: Multiplanar, multiecho pulse sequences of the brain and surrounding structures were obtained without intravenous contrast. COMPARISON:  CT exams from Jul 09, 2020. FINDINGS: Brain: No acute infarction, hemorrhage, hydrocephalus, extra-axial collection or mass lesion. Remote infarct in the left corona radiata. Additional mild-to-moderate, age advanced T2/FLAIR hyperintensities within the white matter and pons. Vascular: Major arterial flow voids are maintained at the skull base. Skull and upper cervical spine: Normal marrow signal. Sinuses/Orbits: Clear sinuses.  Unremarkable orbits. Other: Trace bilateral mastoid fluid. IMPRESSION: 1. No evidence of acute intracranial abnormality. Specifically, no acute infarct.  2. Remote left corona radiata lacunar infarct. 3. Age-advanced mild-to-moderate scattered T2/FLAIR hyperintensities, which are nonspecific but most likely related to chronic microvascular ischemic disease given the patient's risk factors (including hypertension and a hyperlipidemia). Electronically Signed   By: Feliberto Harts MD   On: 07/10/2020 14:31   ECHOCARDIOGRAM COMPLETE  Result Date: 07/11/2020    ECHOCARDIOGRAM REPORT   Patient Name:   SHAUNIE BOEHM Date of Exam: 07/11/2020 Medical Rec #:  [id number]     Height:       64.0 in Accession #:    [id number]    Weight:       192.0 lb Date of Birth:  Sep 28, 1980     BSA:          1.923 m Patient Age:    40 years      BP:           177/116 mmHg Patient Gender: F             HR:  76 bpm. Exam Location:  Inpatient Procedure: 2D Echo, Cardiac Doppler and Color Doppler Indications:    Stroke 434.91 / I163.9  History:        Patient has no prior history of Echocardiogram examinations.                 Risk Factors:Hypertension.  Sonographer:    Roosvelt Maser RDCS Referring Phys: [phone] MCNEILL P KIRKPATRICK IMPRESSIONS  1. Left ventricular ejection fraction, by estimation, is 55 to 60%. The left ventricle has normal function. The left ventricle has no regional wall motion abnormalities. There is severe left ventricular hypertrophy. Left ventricular diastolic parameters  are consistent with Grade III diastolic dysfunction (restrictive). Elevated left atrial pressure.  2. Right ventricular systolic function is mildly reduced. The right ventricular size is normal. Tricuspid regurgitation signal is inadequate for assessing PA pressure.  3. Left atrial size was mildly dilated.  4. The mitral valve is normal in structure. Trivial mitral valve regurgitation.  5. The aortic valve is tricuspid. Aortic valve regurgitation is not visualized. No aortic stenosis is present.  6. The inferior vena cava is normal in size with greater than 50% respiratory variability, suggesting  right atrial pressure of 3 mmHg. FINDINGS  Left Ventricle: Left ventricular ejection fraction, by estimation, is 55 to 60%. The left ventricle has normal function. The left ventricle has no regional wall motion abnormalities. The left ventricular internal cavity size was small. There is severe left ventricular hypertrophy. Left ventricular diastolic parameters are consistent with Grade III diastolic dysfunction (restrictive). Elevated left atrial pressure. Right Ventricle: The right ventricular size is normal. Right vetricular wall thickness was not well visualized. Right ventricular systolic function is mildly reduced. Tricuspid regurgitation signal is inadequate for assessing PA pressure. Left Atrium: Left atrial size was mildly dilated. Right Atrium: Right atrial size was normal in size. Pericardium: There is no evidence of pericardial effusion. Mitral Valve: The mitral valve is normal in structure. Trivial mitral valve regurgitation. Tricuspid Valve: The tricuspid valve is normal in structure. Tricuspid valve regurgitation is not demonstrated. Aortic Valve: The aortic valve is tricuspid. Aortic valve regurgitation is not visualized. No aortic stenosis is present. Aortic valve mean gradient measures 3.0 mmHg. Aortic valve peak gradient measures 5.6 mmHg. Aortic valve area, by VTI measures 2.63 cm. Pulmonic Valve: The pulmonic valve was not well visualized. Pulmonic valve regurgitation is not visualized. Aorta: The aortic root and ascending aorta are structurally normal, with no evidence of dilitation. Venous: The inferior vena cava is normal in size with greater than 50% respiratory variability, suggesting right atrial pressure of 3 mmHg. IAS/Shunts: The interatrial septum was not well visualized.  LEFT VENTRICLE PLAX 2D LVIDd:         3.60 cm  Diastology LVIDs:         2.50 cm  LV e' medial:    4.56 cm/s LV PW:         1.60 cm  LV E/e' medial:  23.9 LV IVS:        1.60 cm  LV e' lateral:   4.47 cm/s LVOT diam:      1.90 cm  LV E/e' lateral: 24.4 LV SV:         47 LV SV Index:   24 LVOT Area:     2.84 cm  RIGHT VENTRICLE RV S prime:     8.50 cm/s TAPSE (M-mode): 1.7 cm LEFT ATRIUM             Index LA diam:  4.60 cm 2.39 cm/m LA Vol (A2C):   76.3 ml 39.68 ml/m LA Vol (A4C):   83.1 ml 43.22 ml/m LA Biplane Vol: 80.0 ml 41.61 ml/m  AORTIC VALVE AV Area (Vmax):    2.47 cm AV Area (Vmean):   2.47 cm AV Area (VTI):     2.63 cm AV Vmax:           118.00 cm/s AV Vmean:          79.400 cm/s AV VTI:            0.179 m AV Peak Grad:      5.6 mmHg AV Mean Grad:      3.0 mmHg LVOT Vmax:         103.00 cm/s LVOT Vmean:        69.200 cm/s LVOT VTI:          0.166 m LVOT/AV VTI ratio: 0.93  AORTA Ao Root diam: 2.70 cm Ao Asc diam:  2.90 cm MITRAL VALVE MV Area (PHT): 4.63 cm     SHUNTS MV Decel Time: 164 msec     Systemic VTI:  0.17 m MV E velocity: 109.00 cm/s  Systemic Diam: 1.90 cm MV A velocity: 42.20 cm/s MV E/A ratio:  2.58 Epifanio Lesches MD Electronically signed by Epifanio Lesches MD Signature Date/Time: 07/11/2020/4:18:06 PM    Final    CT HEAD CODE STROKE WO CONTRAST  Result Date: 07/09/2020 CLINICAL DATA:  Code stroke.  Left-sided weakness. EXAM: CT HEAD WITHOUT CONTRAST TECHNIQUE: Contiguous axial images were obtained from the base of the skull through the vertex without intravenous contrast. COMPARISON:  None. FINDINGS: Brain: No evidence of acute large vascular territory infarct or acute hemorrhage. Remote appearing left corona radiata infarct. No hydrocephalus, mass lesion, mass effect, or sizable extra-axial fluid collection. Mild patchy white matter hypoattenuation is nonspecific but most likely related to chronic microvascular ischemic disease. Vascular: No hyperdense vessel identified. Skull: No acute fracture. Sinuses/Orbits: Clear visualized sinuses. Other: No mastoid effusions. ASPECTS [facility] Stroke Program Early CT Score) Total score (0-10 with 10 being normal): 10. IMPRESSION: 1. No  evidence of acute large vascular territory infarct or acute hemorrhage. ASPECTS is 10. 2. Remote appearing left corona radiata infarct and mild patchy white matter hypoattenuation, most likely related to chronic microvascular ischemic disease. MRI could provide more sensitive evaluation for acute infarct if clinically indicated. Code stroke imaging results were communicated on 07/09/2020 at 2:52 pm to provider Dr. Amada Jupiter via secure text paging. Electronically Signed   By: Feliberto Harts MD   On: 07/09/2020 14:53   VAS US RENAL ARTERY DUPLEX  Result Date: 07/11/2020 ABDOMINAL VISCERAL Patient Name:  MATTYE VERDONE  Date of Exam:   07/11/2020 Medical Rec #: [id number]      Accession #:    [id number] Date of Birth: 1980-12-03      Patient Gender: F Patient Age:   57Y Exam Location:  [facility] Procedure:      VAS US RENAL ARTERY DUPLEX Referring Phys: [id number] Kaiyden Simkin -------------------------------------------------------------------------------- High Risk Factors: Hypertension, current smoker, prior CVA. Limitations: Patient uncooperative with positioning. Performing Technologist: Ernestene Mention  Examination Guidelines: A complete evaluation includes B-mode imaging, spectral Doppler, color Doppler, and power Doppler as needed of all accessible portions of each vessel. Bilateral testing is considered an integral part of a complete examination. Limited examinations for reoccurring indications may be performed as noted.  Duplex Findings: +--------------------+--------+--------+------+--------+ Mesenteric          PSV  cm/sEDV cm/sPlaqueComments +--------------------+--------+--------+------+--------+ Aorta Mid             108      17                  +--------------------+--------+--------+------+--------+ Celiac Artery Origin  125      30                  +--------------------+--------+--------+------+--------+ SMA Proximal          143      11                   +--------------------+--------+--------+------+--------+    +------------------+--------+--------+-------+ Right Renal ArteryPSV cm/sEDV cm/sComment +------------------+--------+--------+-------+ Origin               47      15           +------------------+--------+--------+-------+ Proximal             42      18           +------------------+--------+--------+-------+ Mid                  46      14           +------------------+--------+--------+-------+ Distal               42      16           +------------------+--------+--------+-------+ +-----------------+--------+--------+-------+ Left Renal ArteryPSV cm/sEDV cm/sComment +-----------------+--------+--------+-------+ Origin              55      19           +-----------------+--------+--------+-------+ Proximal            67      24           +-----------------+--------+--------+-------+ Mid                 62      21           +-----------------+--------+--------+-------+ Distal              41      15           +-----------------+--------+--------+-------+ +------------+--------+--------+----+-----------+--------+--------+----+ Right KidneyPSV cm/sEDV cm/sRI  Left KidneyPSV cm/sEDV cm/sRI   +------------+--------+--------+----+-----------+--------+--------+----+ Upper Pole  21      8       0.62Upper Pole 26      9       0.64 +------------+--------+--------+----+-----------+--------+--------+----+ Mid         28      12      0.        20      8       0.60 +------------+--------+--------+----+-----------+--------+--------+----+ Lower Pole  22      10      0.47Lower Pole 15      7       0.56 +------------+--------+--------+----+-----------+--------+--------+----+ Hilar       42      13      0.68Hilar      38      14      0.63 +------------+--------+--------+----+-----------+--------+--------+----+ +------------------+----+------------------+----+ Right Kidney           Left Kidney            +------------------+----+------------------+----+ RAR                   RAR                    +------------------+----+------------------+----+  RAR (manual)          RAR (manual)           +------------------+----+------------------+----+ Cortex            0.59Cortex            0.50 +------------------+----+------------------+----+ Cortex thickness      Corex thickness        +------------------+----+------------------+----+ Kidney length (cm)9.71Kidney length (cm)9.99 +------------------+----+------------------+----+  Summary: Renal:  Right: Normal size right kidney. Normal right Resisitive Index. No        evidence of right renal artery stenosis. Left:  Normal size of left kidney. Normal left Resistive Index. No        evidence of left renal artery stenosis. Mesenteric: Normal Celiac artery and Superior Mesenteric artery findings. Areas of limited visceral study include left kidney size.  *See table(s) above for measurements and observations.  Diagnosing physician: Waverly Ferrari MD  Electronically signed by Waverly Ferrari MD on 07/11/2020 at 1:11:27 PM.    Final    CT ANGIO HEAD NECK W WO CM (CODE STROKE)  Result Date: 07/09/2020 CLINICAL DATA:  Stroke follow-up. EXAM: CT ANGIOGRAPHY HEAD AND NECK TECHNIQUE: Multidetector CT imaging of the head and neck was performed using the standard protocol during bolus administration of intravenous contrast. Multiplanar CT image reconstructions and MIPs were obtained to evaluate the vascular anatomy. Carotid stenosis measurements (when applicable) are obtained utilizing NASCET criteria, using the distal internal carotid diameter as the denominator. CONTRAST:  53mL OMNIPAQUE IOHEXOL 350 MG/ML SOLN COMPARISON:  None. FINDINGS: CTA NECK FINDINGS Aortic arch: Great vessel origins are patent. Right carotid system: No evidence of dissection, stenosis (50% or greater) or occlusion. Left carotid system: No  evidence of dissection, stenosis (50% or greater) or occlusion. Vertebral arteries: Left dominant. No evidence of dissection, stenosis (50% or greater) or occlusion. Skeleton: Mild degenerative change. Other neck: No evidence of acute abnormality. Upper chest: Mild dependent ground-glass opacities in the lung apices, likely atelectasis. Otherwise, visualized lung apices are clear. Review of the MIP images confirms the above findings CTA HEAD FINDINGS Anterior circulation: No large vessel occlusion. Approximately 40-50% left paraclinoid ICA narrowing (series 7, image 25). Late left MCA bifurcation. Bilateral MCAs and ACAs are patent without proximal hemodynamically significant stenosis. No aneurysm. Posterior circulation: Left dominant intradural vertebral artery. No large vessel occlusion or proximal hemodynamically significant stenosis. Mild right P2 PCA narrowing. No aneurysm. Venous sinuses: As permitted by contrast timing, patent. Narrowing of bilateral transverse sinuses. Review of the MIP images confirms the above findings IMPRESSION: CTA Head: 1. No large vessel occlusion. 2. Approximately 40-50% left paraclinoid ICA narrowing. CTA Neck: No significant (greater than 50%) stenosis. Electronically Signed   By: Feliberto Harts MD   On: 07/09/2020 16:23      HISTORY OF PRESENT ILLNESS Ms. Etana Beets is a 40 y.o. female with history of HTN, Crohn's disease admitted for left sided weakness and numbness. TPA given.   HOSPITAL COURSE Jatziri Bartl was admitted for post thrombolytic monitoring and care in the neurologic ICU. She remained hemodynamically and neurologically stable with resolution of her presenting left sided weakness and numbness. 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  Ongoing aggressive stroke risk factor management  Therapy recommendations: None  Disposition:  Pending   Hypertensive emergency  Home med - norvasc 5, labetalol 800 tid, HCTZ and spironolactone  BP stable now  Off Cleviprex  Resume home norvasc 10  Put on metoprolol 50mg  q 8h and then 75 mg BID at discharge  as pt stated that labetalol not effective and she does have borderline tachycardia  May consider at ACEI or ARBs  Renal artery [place] negative for renal artery stenosis  Long term BP goal normotensive  Hyperlipidemia  Home meds:  none   LDL 75, TG 353, goal < 70  Now on lipitor 40 and zetia 10  Continue statin at discharge  Other Stroke Risk Factors  Obesity, Body mass index is 32.96 kg/m.   Other Active Problems  Crohn's disease  Hx of psorasis    DISCHARGE EXAM Blood pressure (!) 159/111, pulse 74, temperature 97.8 F (36.6 C), temperature source Oral, resp. rate 15, height 5\' 4"  (1.626 m), weight 87.1 kg, SpO2 99 %. General - Well nourished, well developed female sitting up in bed in no apparent distress. In good spirits and anxious for discharge.  Cardiovascular - Regular rhythm  Mental Status -  Level of arousal and orientation to time, place, and person were intact.Language including expression, naming, repetition, comprehension was assessed and found intact. Attention span and concentration were normal. Fund of Knowledge was assessed and was intact.  Cranial Nerves II - XII - II - Visual field intact OU. III, IV, VI - Extraocular movements intact. V - Facial sensation intact bilaterally. VII - Facial movement intact bilaterally. VIII - Hearing & vestibular intact bilaterally. X - Palate  elevates symmetrically. XI - Chin turning & shoulder shrug intact bilaterally. XII - Tongue protrusion intact.  Motor Strength - The patient's strength was normal in all extremities and pronator drift was absent.  Bulk was normal and fasciculations were absent.   Motor Tone - Muscle tone was assessed at the neck and appendages and was normal.  Sensory - Light touch was assessed and was symmetrical.    Coordination - The patient had normal movements in the hands and feet with no ataxia or dysmetria.  Tremor was absent.  Gait and Station - deferred. Discharge Diet       There are no active orders of the following types: Diet, Nourishments.   liquids  DISCHARGE PLAN  Disposition:  Home   DAPT x 3 weeks then ASA 81 alone   Ongoing stroke risk factor control by Primary Care Physician at time of discharge  Follow-up PCP Pcp, No in 2 weeks.  Follow-up in [facility] in 4 weeks, office to schedule an appointment.   35 minutes were spent preparing discharge.  Delila A Bailey-Modzik, NP-C  ATTENDING NOTE: I reviewed above note and agree with the assessment and plan. Pt was seen and examined.   Patient doing well, no acute event overnight, BP well controlled after adding losartan.  Continue current BP meds regimen.  Continue aspirin and Plavix for 3 weeks and then aspirin alone.  Continue statin and Zetia.  Patient ready for discharge, close follow-up with PCP and neurology after discharge.  Social worker has arranged PCP follow-up.  For detailed assessment and plan, please refer to above  as I have made changes wherever appropriate.   Marvel Plan, MD PhD Stroke Neurology 07/12/2020 8:48 PM

## 2020-07-12 NOTE — Progress Notes (Signed)
Pt is excited to be discharged home. Pt is looking forward to seeing her kids.    Pt is able to independently dress herself, walk around the room, and toilet herself independently.    Pt has boyfriend picking her up.    RN went over all discharge papers.  Pt was able to verbalize the medications she needs to take this evening, understands where to find information in her discharge instructions.    Vanessa Ralphs, RN

## 2020-07-12 NOTE — Plan of Care (Signed)
  Problem: Clinical Measurements: Goal: Ability to maintain clinical measurements within normal limits will improve 07/12/2020 1148 by Ventry-Smith, Shiquita Collignon R, RN Outcome: Completed/Met 07/12/2020 1148 by Sherril Cong R, RN Outcome: Adequate for Discharge Goal: Will remain free from infection 07/12/2020 1148 by Sherril Cong R, RN Outcome: Completed/Met 07/12/2020 1148 by Sherril Cong R, RN Outcome: Adequate for Discharge Goal: Diagnostic test results will improve 07/12/2020 1148 by Trenton Founds, RN Outcome: Completed/Met 07/12/2020 1148 by Sherril Cong R, RN Outcome: Adequate for Discharge Goal: Respiratory complications will improve 07/12/2020 1148 by Trenton Founds, RN Outcome: Completed/Met 07/12/2020 1148 by Sherril Cong R, RN Outcome: Adequate for Discharge Goal: Cardiovascular complication will be avoided 07/12/2020 1148 by Sherril Cong R, RN Outcome: Completed/Met 07/12/2020 1148 by Sherril Cong R, RN Outcome: Adequate for Discharge   Problem: Nutrition: Goal: Adequate nutrition will be maintained 07/12/2020 1148 by Sherril Cong R, RN Outcome: Completed/Met 07/12/2020 1148 by Sherril Cong R, RN Outcome: Adequate for Discharge   Problem: Elimination: Goal: Will not experience complications related to bowel motility 07/12/2020 1148 by Sherril Cong R, RN Outcome: Completed/Met 07/12/2020 1148 by Sherril Cong R, RN Outcome: Adequate for Discharge Goal: Will not experience complications related to urinary retention 07/12/2020 1148 by Sherril Cong R, RN Outcome: Completed/Met 07/12/2020 1148 by Trenton Founds, RN Outcome: Adequate for Discharge

## 2020-07-12 NOTE — TOC Transition Note (Signed)
Transition of Care [facility]) - CM/SW Discharge Note   Patient Details  Name: Kaysee Hergert MRN: [id number] Date of Birth: 1980-07-11  Transition of Care [facility] CM/SW Contact:  Glennon Mac, RN Phone Number: 07/12/2020, 12:22 PM   Clinical Narrative:   Pt medically stable for dc home today with family/SO to assist.  PCP follow up appt has been made at [facility] and [facility].      Final next level of care: Home/Self Care Barriers to Discharge: Continued Medical Work up   Patient Goals and CMS Choice Patient states their goals for this hospitalization and ongoing recovery are:: to go home                             Discharge Plan and Services   Discharge Planning Services: CM Consult,Follow-up appt scheduled                                 Social Determinants of Health (SDOH) Interventions     Readmission Risk Interventions Readmission Risk Prevention Plan 07/11/2020  Post Dischage Appt Complete  Medication Screening Complete  Transportation Screening Complete   Quintella Baton, RN, BSN  Trauma/Neuro ICU Case Manager [phone]

## 2020-08-01 NOTE — Progress Notes (Signed)
Post tpa NS 35mL flush given at 1556 at the ordered rate of 78.1/hr.

## 2020-08-05 ENCOUNTER — Other Ambulatory Visit: Payer: Self-pay

## 2020-08-05 ENCOUNTER — Emergency Department (HOSPITAL_COMMUNITY)
Admission: EM | Admit: 2020-08-05 | Discharge: 2020-08-05 | Disposition: A | Discharge status: Home / Self Care | Payer: Medicaid Other | Attending: Emergency Medicine | Admitting: Emergency Medicine

## 2020-08-05 DIAGNOSIS — Z91010 Allergy to peanuts: Secondary | ICD-10-CM | POA: Insufficient documentation

## 2020-08-05 DIAGNOSIS — M7918 Myalgia, other site: Secondary | ICD-10-CM | POA: Insufficient documentation

## 2020-08-05 DIAGNOSIS — M791 Myalgia, unspecified site: Secondary | ICD-10-CM

## 2020-08-05 DIAGNOSIS — B9689 Other specified bacterial agents as the cause of diseases classified elsewhere: Secondary | ICD-10-CM | POA: Diagnosis not present

## 2020-08-05 DIAGNOSIS — Z7982 Long term (current) use of aspirin: Secondary | ICD-10-CM | POA: Insufficient documentation

## 2020-08-05 DIAGNOSIS — Z28310 Unvaccinated for covid-19: Secondary | ICD-10-CM | POA: Insufficient documentation

## 2020-08-05 DIAGNOSIS — N760 Acute vaginitis: Secondary | ICD-10-CM | POA: Insufficient documentation

## 2020-08-05 DIAGNOSIS — Z7902 Long term (current) use of antithrombotics/antiplatelets: Secondary | ICD-10-CM | POA: Diagnosis not present

## 2020-08-05 DIAGNOSIS — Z20822 Contact with and (suspected) exposure to covid-19: Secondary | ICD-10-CM | POA: Diagnosis not present

## 2020-08-05 LAB — BASIC METABOLIC PANEL
Anion gap: 9 (ref 5–15)
BUN: 17 mg/dL (ref 6–20)
CO2: 20 mmol/L — ABNORMAL LOW (ref 22–32)
Calcium: 9.3 mg/dL (ref 8.9–10.3)
Chloride: 108 mmol/L (ref 98–111)
Creatinine, Ser: 1.51 mg/dL — ABNORMAL HIGH (ref 0.44–1.00)
GFR, Estimated: 45 mL/min — ABNORMAL LOW (ref >60)
Glucose, Bld: 116 mg/dL — ABNORMAL HIGH (ref 70–99)
Potassium: 4.5 mmol/L (ref 3.5–5.1)
Sodium: 137 mmol/L (ref 135–145)

## 2020-08-05 LAB — CBC WITH DIFFERENTIAL/PLATELET
Abs Immature Granulocytes: 0.04 10*3/uL (ref 0.00–0.07)
Basophils Absolute: 0 10*3/uL (ref 0.0–0.1)
Basophils Relative: 0 %
Eosinophils Absolute: 0 10*3/uL (ref 0.0–0.5)
Eosinophils Relative: 0 %
HCT: 32.6 % — ABNORMAL LOW (ref 36.0–46.0)
Hemoglobin: 10.3 g/dL — ABNORMAL LOW (ref 12.0–15.0)
Immature Granulocytes: 0 %
Lymphocytes Relative: 13 %
Lymphs Abs: 1.4 10*3/uL (ref 0.7–4.0)
MCH: 25.6 pg — ABNORMAL LOW (ref 26.0–34.0)
MCHC: 31.6 g/dL (ref 30.0–36.0)
MCV: 80.9 fL (ref 80.0–100.0)
Monocytes Absolute: 0.7 10*3/uL (ref 0.1–1.0)
Monocytes Relative: 6 %
Neutro Abs: 8.5 10*3/uL — ABNORMAL HIGH (ref 1.7–7.7)
Neutrophils Relative %: 81 %
Platelets: 336 10*3/uL (ref 150–400)
RBC: 4.03 MIL/uL (ref 3.87–5.11)
RDW: 15.8 % — ABNORMAL HIGH (ref 11.5–15.5)
WBC: 10.6 10*3/uL — ABNORMAL HIGH (ref 4.0–10.5)
nRBC: 0 % (ref 0.0–0.2)

## 2020-08-05 LAB — URINALYSIS, ROUTINE W REFLEX MICROSCOPIC
Bilirubin Urine: NEGATIVE
Glucose, UA: NEGATIVE mg/dL
Nitrite: NEGATIVE
Specific Gravity, Urine: 1.025 (ref 1.005–1.030)
pH: 5 (ref 5.0–8.0)

## 2020-08-05 LAB — WET PREP, GENITAL
Sperm: NONE SEEN
Trich, Wet Prep: NONE SEEN
Yeast Wet Prep HPF POC: NONE SEEN

## 2020-08-05 LAB — I-STAT BETA HCG BLOOD, ED (MC, WL, AP ONLY): I-stat hCG, quantitative: <5 m[IU]/mL (ref <5)

## 2020-08-05 LAB — CK: Total CK: 54 U/L (ref 38–234)

## 2020-08-05 MED ORDER — SODIUM CHLORIDE 0.9 % IV SOLN
1.0000 g | Freq: Once | INTRAVENOUS | Status: AC
  Administered 2020-08-05: 1 g via INTRAVENOUS
  Filled 2020-08-05: qty 10

## 2020-08-05 MED ORDER — ACETAMINOPHEN 500 MG PO TABS
1000.0000 mg | ORAL_TABLET | Freq: Once | ORAL | Status: AC
  Administered 2020-08-05: 1000 mg via ORAL
  Filled 2020-08-05: qty 2

## 2020-08-05 MED ORDER — METRONIDAZOLE 500 MG PO TABS: 500.0000 mg | ORAL_TABLET | Freq: Two times a day (BID) | ORAL | 0 refills | Status: AC

## 2020-08-05 MED ORDER — KETOROLAC TROMETHAMINE 30 MG/ML IJ SOLN: 30.0000 mg | Freq: Once | INTRAMUSCULAR | Status: DC

## 2020-08-05 MED ORDER — SODIUM CHLORIDE 0.9 % IV BOLUS
1000.0000 mL | Freq: Once | INTRAVENOUS | Status: AC
  Administered 2020-08-05: 1000 mL via INTRAVENOUS

## 2020-08-05 MED ORDER — IBUPROFEN 600 MG PO TABS: 600.0000 mg | ORAL_TABLET | Freq: Four times a day (QID) | ORAL | 0 refills | Status: AC | PRN

## 2020-08-05 MED ORDER — DEXTROSE 5 % IV SOLN: 500.0000 mg | Freq: Once | INTRAVENOUS | Status: DC

## 2020-08-05 NOTE — ED Triage Notes (Signed)
Patient reports waking up today with body aches all over. Pain 10/10. Denies any sick contacts.,

## 2020-08-05 NOTE — ED Notes (Signed)
Pt attempted UA sample, requesting STD testing

## 2020-08-05 NOTE — Discharge Instructions (Addendum)
Your body aches and pain may be indication of a COVID infection.  Please check result through MyChart, link below.  Wet prep pelvic exam with evidence of bacterial vaginosis, take antibiotic as prescribed but avoid drinking alcohol while taking antibiotic as it can make you sick.  If the symptoms worsen or if you have any concern, do not hesitate to return for further evaluation.

## 2020-08-05 NOTE — ED Provider Notes (Signed)
[facility]-EMERGENCY DEPT Provider Note   CSN: [id number] Arrival date & time: 08/05/20  0848     History Chief Complaint  Patient presents with   Generalized Body Aches    Tanya Hamilton is a 40 y.o. female.  The history is provided by the patient. No language interpreter was used.   40 year old female presenting with complaints of myalgia.  Patient reports waking up this morning with body aches throughout her entire body.  Achiness is moderate to severe, persistent, worse with any kind of movement.  No report of fever headache runny nose sneezing coughing sore throat chest pain shortness of breath abdominal pain.  She did notice some mild urinary discomfort this morning.  She has not been vaccinated for COVID-19.  She denies any recent sick contact.  She denies any recent medication changes and have been on her Lipitor for about a year and a half.  She denies any specific treatment tried  No past medical history on file.  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   OB History   No obstetric history on file.     No family history on file.  Social History   Tobacco Use   Smoking status: Unknown    Home Medications Prior to Admission medications   Medication Sig Start Date End Date Taking? Authorizing Provider  amLODipine (NORVASC) 10 MG tablet Take 1 tablet (10 mg total) by mouth every morning. 07/12/20   Bailey-Modzik, Delila A, NP  aspirin EC 81 MG EC tablet Take 1 tablet (81 mg total) by mouth daily. Swallow whole. 07/13/20   Bailey-Modzik, Delila A, NP  atorvastatin (LIPITOR) 40 MG tablet Take 1 tablet (40 mg total) by mouth daily. 07/13/20   Bailey-Modzik, Delila A, NP  clopidogrel (PLAVIX) 75 MG tablet Take 1 tablet (75 mg total) by mouth daily for 21 days. 08/03/20 08/24/20  Bailey-Modzik, Jillene Bucks A, NP  ezetimibe (ZETIA) 10 MG tablet Take 1 tablet (10 mg total) by  mouth daily. 07/13/20   Bailey-Modzik, Delila A, NP  losartan (COZAAR) 50 MG tablet Take 1 tablet (50 mg total) by mouth 2 (two) times daily. 07/12/20   Bailey-Modzik, Delila A, NP  metoprolol tartrate (LOPRESSOR) 50 MG tablet Take 1.5 tablets (75 mg total) by mouth 2 (two) times daily. 07/12/20   Bailey-Modzik, Delila A, NP  Multiple Vitamin (MULTIVITAMIN WITH MINERALS) TABS tablet Take 1 tablet by mouth at bedtime.    [provider]  triamcinolone ointment (KENALOG) 0.1 % Apply 1 application topically 2 (two) times daily. For plaque psoriasis    [provider]    Allergies    Other, Peanut-containing drug products, Hydralazine, and Lisinopril  Review of Systems   Review of Systems  All other systems reviewed and are negative.  Physical Exam Updated Vital Signs BP (!) 167/106   Pulse 97   Temp 98.2 F (36.8 C) (Oral)   Resp 17   Ht 5\' 5"  (1.651 m)   Wt 81.6 kg   LMP 08/01/2020 (Exact Date)   SpO2 98%   BMI 29.95 kg/m   Physical Exam Vitals and nursing note reviewed.  Constitutional:      General: She is not in acute distress.    Appearance: She is well-developed.     Comments: Slow-moving in bed appears uncomfortable but nontoxic  HENT:     Head: Atraumatic.  Eyes:  Conjunctiva/sclera: Conjunctivae normal.  Cardiovascular:     Rate and Rhythm: Normal rate and regular rhythm.     Pulses: Normal pulses.     Heart sounds: Normal heart sounds.  Pulmonary:     Effort: Pulmonary effort is normal.  Abdominal:     Palpations: Abdomen is soft.  Musculoskeletal:     Cervical back: Neck supple.  Skin:    Findings: No rash.     Comments: Expresses discomfort with gentle palpation of different body parts throughout  Neurological:     Mental Status: She is alert.  Psychiatric:        Mood and Affect: Mood normal.    ED Results / Procedures / Treatments   Labs (all labs ordered are listed, but only abnormal results are displayed) Labs Reviewed  WET  PREP, GENITAL - Abnormal; Notable for the following components:      Result Value   Clue Cells Wet Prep HPF POC PRESENT (*)    WBC, Wet Prep HPF POC MANY (*)    All other components within normal limits  CBC WITH DIFFERENTIAL/PLATELET - Abnormal; Notable for the following components:   WBC 10.6 (*)    Hemoglobin 10.3 (*)    HCT 32.6 (*)    MCH 25.6 (*)    RDW 15.8 (*)    Neutro Abs 8.5 (*)    All other components within normal limits  URINALYSIS, ROUTINE W REFLEX MICROSCOPIC - Abnormal; Notable for the following components:   APPearance CLEAR (*)    Hgb urine dipstick TRACE (*)    Ketones, ur TRACE (*)    Protein, ur TRACE (*)    Leukocytes,Ua TRACE (*)    Bacteria, UA RARE (*)    All other components within normal limits  BASIC METABOLIC PANEL - Abnormal; Notable for the following components:   CO2 20 (*)    Glucose, Bld 116 (*)    Creatinine, Ser 1.51 (*)    GFR, Estimated 45 (*)    All other components within normal limits  SARS CORONAVIRUS 2 (TAT 6-24 HRS)  CK  I-STAT BETA HCG BLOOD, ED ([facility], [facility], [facility] ONLY)  GC/CHLAMYDIA PROBE AMP ([place]) NOT AT [facility]    EKG None  Radiology No results found.  Procedures Pelvic exam  Date/Time: 08/05/2020 11:27 AM Performed by: Fayrene Helper, PA-C Authorized by: Fayrene Helper, PA-C  Consent: Verbal consent obtained. Risks and benefits: risks, benefits and alternatives were discussed Consent given by: patient Patient identity confirmed: verbally with patient Comments: Pelvic exam performed with permission of pt and female ED tech assist during exam.  External genitalia w/out lesions.  Vaginal vault with curd-like moderate discharge.  Cervix w/out lesions, not friable, GC/Chlamydia and wet prep obtained and sent to lab.  Bimanual exam L adnexal tenderness, firmness to R axilla, and CMT.        Medications Ordered in ED Medications  sodium chloride 0.9 % bolus 1,000 mL (0 mLs Intravenous Stopped 08/05/20 1318)  acetaminophen  (TYLENOL) tablet 1,000 mg (1,000 mg Oral Given 08/05/20 1006)  cefTRIAXone (ROCEPHIN) 1 g in sodium chloride 0.9 % 100 mL IVPB (0 g Intravenous Stopped 08/05/20 1220)    ED Course  I have reviewed the triage vital signs and the nursing notes.  Pertinent labs & imaging results that were available during my care of the patient were reviewed by me and considered in my medical decision making (see chart for details).    MDM Rules/Calculators/A&P  BP (!) 168/99   Pulse 100   Temp 98.2 F (36.8 C) (Oral)   Resp 18   Ht 5\' 5"  (1.651 m)   Wt 81.6 kg   LMP 08/01/2020 (Exact Date)   SpO2 100%   BMI 29.95 kg/m   Final Clinical Impression(s) / ED Diagnoses Final diagnoses:  Myalgia  BV (bacterial vaginosis)  Suspected COVID-19 virus infection    Rx / DC Orders ED Discharge Orders     None      9:56 AM Patient here with myalgia that started today.  Likely signs of a viral infection such as COVID but she does not have any COVID symptoms at this time.  She has not been vaccinated.  Patient however is currently on a statin medication which she has been on it for the past 1 to 2 years.  She has not noticed any dark color urine but also did notice some mild urinary discomfort.  We will give IV fluid, evaluate for potential rhabdomyolysis although my suspicion is low.  COVID test obtained.  10:48 AM Patient report request for STI check.  States she did have a new sexual partner not using protection.  She denies any vaginal bleeding or vaginal discharge.  In the setting of myalgia, will perform pelvic exam.    4:37 PM UA without signs of urine tract infection.  Wet prep shows many WBC and presence of clue cells.  The remainder of her labs are reassuring.  COVID-19 test is currently pending.  She has normal CK therefore low suspicion for rhabdomyolysis.  Patient without any complaints of chest pain shortness of breath productive cough to warrant chest x-ray  Tanya Hamilton was evaluated in Emergency Department on 08/05/2020 for the symptoms described in the history of present illness. She was evaluated in the context of the global COVID-19 pandemic, which necessitated consideration that the patient might be at risk for infection with the SARS-CoV-2 virus that causes COVID-19. Institutional protocols and algorithms that pertain to the evaluation of patients at risk for COVID-19 are in a state of rapid change based on information released by regulatory bodies including the CDC and federal and state organizations. These policies and algorithms were followed during the patient's care in the ED.    08/07/2020, PA-C 08/05/20 1643    08/07/20, MD 08/06/20 [phone]

## 2020-08-06 LAB — SARS CORONAVIRUS 2 (TAT 6-24 HRS): SARS Coronavirus 2: NEGATIVE

## 2020-08-07 LAB — GC/CHLAMYDIA PROBE AMP (~~LOC~~) NOT AT ARMC
Chlamydia: NEGATIVE
Comment: NEGATIVE
Comment: NORMAL
Neisseria Gonorrhea: NEGATIVE

## 2020-08-17 ENCOUNTER — Inpatient Hospital Stay: Payer: Medicaid Other | Admitting: Physician Assistant

## 2020-08-23 ENCOUNTER — Inpatient Hospital Stay: Payer: Medicaid Other | Admitting: Adult Health

## 2020-09-26 ENCOUNTER — Inpatient Hospital Stay: Payer: Medicaid Other | Admitting: Adult Health

## 2020-09-26 ENCOUNTER — Encounter: Payer: Self-pay | Admitting: Adult Health

## 2020-09-26 NOTE — Progress Notes (Deleted)
[facility] [address] [place]. [place] [address] [phone]       HOSPITAL FOLLOW UP NOTE  Ms. Tanya Hamilton Date of Birth:  1980-12-15 Medical Record Number:  [id number]   Reason for Referral:  hospital stroke follow up    SUBJECTIVE:   CHIEF COMPLAINT:  No chief complaint on file.   HPI:   Ms. Tanya Hamilton is a 40 y.o. female with history of HTN, Crohn's disease admitted on 07/09/2020 for left sided weakness and numbness.  Personally reviewed hospitalization pertinent progress notes, lab work and imaging.  Evaluated by Dr. Roda Shutters for aborted right sided stroke/TIA s/p tPA likely secondary to small vessel disease source. MR brain no acute stroke, remote left CR lacunar infarct.  CTA head/neck left ICA siphon 40 to 50% stenosis EF ***.  LDL 75, TG 353.  A1c 6.0.  No antithrombotic PTA -recommended DAPT for 3 weeks and aspirin alone as well as starting atorvastatin 40 mg daily and Zetia 10 mg daily.  Presented with hypertensive emergency treated with Cleviprex and discharged on amlodipine 10 mg daily (on 5mg  PTA), losartan 50 mg twice daily and metoprolol 75 mg twice daily -labetalol, hydrochlorothiazide and spironolactone discontinued.  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  PT/OT no therapy needs.        ROS:   14 system review of systems performed and negative with exception of ***  PMH: No past medical history on file.  PSH: No past surgical history on file.  Social History:  Social History   Socioeconomic History   Marital status: Unknown    Spouse name: Not on file   Number of children: Not on file   Years of education: Not on file   Highest education level: Not on file  Occupational History   Not on file  Tobacco Use   Smoking status: Unknown   Smokeless tobacco: Not on file  Substance and Sexual Activity   Alcohol use: Not on file   Drug use: Not on file   Sexual activity: Not on file  Other Topics Concern   Not on file  Social  History Narrative   Not on file   Social Determinants of Health   Financial Resource Strain: Not on file  Food Insecurity: Not on file  Transportation Needs: Not on file  Physical Activity: Not on file  Stress: Not on file  Social Connections: Not on file  Intimate Partner Violence: Not on file    Family History: No family history on file.  Medications:   Current Outpatient Medications on File Prior to Visit  Medication Sig Dispense Refill   amLODipine (NORVASC) 10 MG tablet Take 1 tablet (10 mg total) by mouth every morning. 30 tablet 1   aspirin EC 81 MG EC tablet Take 1 tablet (81 mg total) by mouth daily. Swallow whole. 30 tablet 11   atorvastatin (LIPITOR) 40 MG tablet Take 1 tablet (40 mg total) by mouth daily. 30 tablet 1   ezetimibe (ZETIA) 10 MG tablet Take 1 tablet (10 mg total) by mouth daily. 30 tablet 1   ibuprofen (ADVIL) 600 MG tablet Take 1 tablet (600 mg total) by mouth every 6 (six) hours as needed for moderate pain. 30 tablet 0   losartan (COZAAR) 50 MG tablet Take 1 tablet (50 mg total) by mouth 2 (two) times daily. 60 tablet 0   metoprolol tartrate (LOPRESSOR) 50 MG tablet Take 1.5 tablets (75 mg total) by mouth 2 (two) times daily. 90 tablet 1  metroNIDAZOLE (FLAGYL) 500 MG tablet Take 1 tablet (500 mg total) by mouth 2 (two) times daily. One po bid x 7 days 14 tablet 0   Multiple Vitamin (MULTIVITAMIN WITH MINERALS) TABS tablet Take 1 tablet by mouth at bedtime.     triamcinolone ointment (KENALOG) 0.1 % Apply 1 application topically 2 (two) times daily. For plaque psoriasis     No current facility-administered medications on file prior to visit.    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 There is no height or weight on file to calculate BMI. No results found.  No flowsheet data found.   General: well developed, well nourished, seated, in no evident distress Head: head normocephalic and atraumatic.   Neck: supple with no carotid or supraclavicular bruits Cardiovascular: regular rate and rhythm, no murmurs Musculoskeletal: no deformity Skin:  no rash/petichiae Vascular:  Normal pulses all extremities   Neurologic Exam Mental Status: Awake and fully alert. Oriented to place and time. Recent and remote memory intact. Attention span, concentration and fund of knowledge appropriate. Mood and affect appropriate.  Cranial Nerves: Fundoscopic exam reveals sharp disc margins. Pupils equal, briskly reactive to light. Extraocular movements full without nystagmus. Visual fields full to confrontation. Hearing intact. Facial sensation intact. Face, tongue, palate moves normally and symmetrically.  Motor: Normal bulk and tone. Normal strength in all tested extremity muscles Sensory.: intact to touch , pinprick , position and vibratory sensation.  Coordination: Rapid alternating movements normal in all extremities. Finger-to-nose and heel-to-shin performed accurately bilaterally. Gait and Station: Arises from chair without difficulty. Stance is normal. Gait demonstrates normal stride length and balance with ***. Tandem walk and heel toe ***.  Reflexes: 1+ and symmetric. Toes downgoing.     NIHSS  *** Modified Rankin  ***      ASSESSMENT: Tanya Hamilton is a 40 y.o. year old female with aborted stroke/TIA s/p tPA on 07/09/2020 likely secondary to small vessel disease in setting of uncontrolled HTN after presenting with left-sided weakness and numbness.  Vascular risk factors include uncontrolled HTN, HLD, pre-DM, left ICA stenosis impressions and imaging.      PLAN:  Aborted stroke/TIA s/p tPA: Residual deficit: ***. Continue aspirin 81 mg daily  and atorvastatin 40 mg  daily and Zetia 10 mg daily for secondary stroke prevention.  Discussed secondary stroke prevention measures and importance of close PCP follow up for aggressive stroke risk factor management. I have gone over the pathophysiology of stroke, warning signs and symptoms, risk factors and their management in some detail with instructions to go to the closest emergency room for symptoms of concern. HTN: BP goal <130/90.  Stable on *** per PCP HLD: LDL goal <70. Recent LDL 75, TG 353.  Continue current statin regimen Pre-DMII: A1c goal<7.0. Recent A1c 6.0.     Follow up in *** or call earlier if needed   CC:  [facility] provider: Dr. Pearlean Brownie PCP: Pcp, No    I spent *** minutes of face-to-face and non-face-to-face time with patient.  This included previsit chart review, lab review, study review, order entry, electronic health record documentation, patient education regarding recent stroke, residual deficits, importance of managing stroke risk factors and  answered all questions to patient satisfaction     Frann Rider, [facility]  [facility] [address] [place], [place] [address]  Phone [phone] Fax [phone] Note: This document was prepared with digital dictation and possible smart phrase technology. Any transcriptional errors that result from this process are unintentional.

## 2020-10-17 ENCOUNTER — Other Ambulatory Visit: Payer: Self-pay

## 2020-10-17 NOTE — Patient Outreach (Signed)
[facility]) Care Management  10/17/2020  Tanya Hamilton 05/29/1980 [id number]   First telephone outreach attempt to obtain mRS. No answer. Left message for returned call.  [name] [facility] Management Assistant [phone]

## 2020-10-23 ENCOUNTER — Other Ambulatory Visit: Payer: Self-pay

## 2020-10-23 NOTE — Patient Outreach (Signed)
[facility]) Care Management  10/23/2020  Tanya Hamilton 09/18/1980 [id number]   Second telephone outreach attempt to obtain mRS. No answer. Unable to leave message for returned call.  [name] [facility] Management Assistant [phone]

## 2020-10-24 ENCOUNTER — Other Ambulatory Visit: Payer: Self-pay

## 2020-10-24 NOTE — Patient Outreach (Signed)
[facility]) Care Management  10/24/2020  Kayah Hecker 11-24-1980 [id number]   3 outreach attempts were completed to obtain mRs. mRs could not be obtained because patient never returned my calls. mRs=7    [name] Care Management Assistant [phone]

## 2023-01-01 IMAGING — MR MR HEAD W/O CM
11 of 12 series · 44 of 48 positions shown · non-contrast
Comparison: CT exams from July 09, 2020.

CLINICAL DATA: Status post tPA.  Left-sided weakness.

EXAM:
MRI HEAD WITHOUT CONTRAST
TECHNIQUE: Multiplanar, multiecho pulse sequences of the brain and surrounding
structures were obtained without intravenous contrast.

[Series 5: DWI · axial · 3.0mm · 0.88mm/px · z∈[-99,+41]mm · 10 of 96 slices shown (1 of 4)]
[im 1/96]
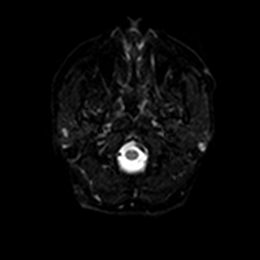
[im 11/96]
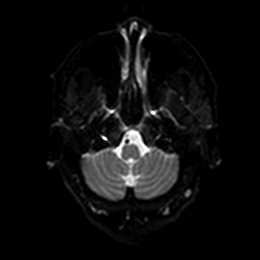
[im 22/96]
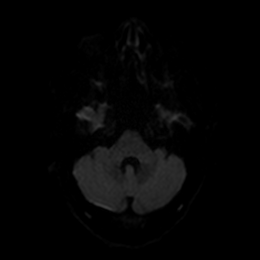
[im 32/96]
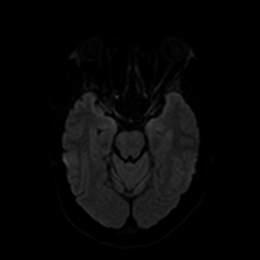
[im 43/96]
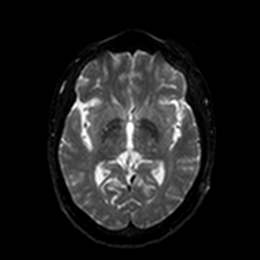
[im 53/96]
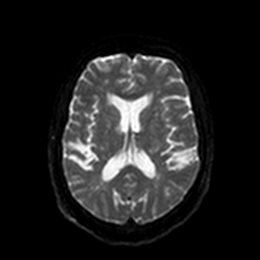
[im 64/96]
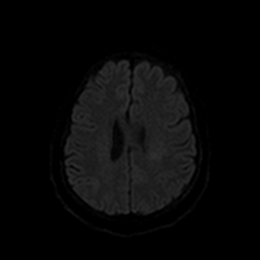
[im 74/96]
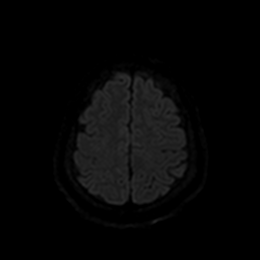
[im 85/96]
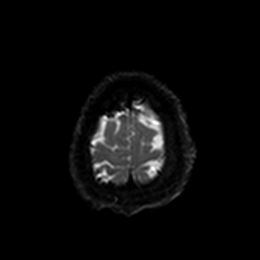
[im 96/96]
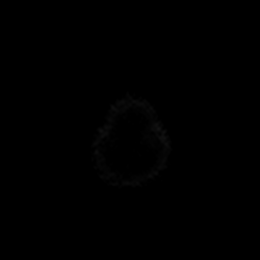

[Series 6: DWI · axial · 3.0mm · 0.88mm/px · z∈[-99,+41]mm · 4 of 48 slices shown (2 of 4)]
[im 1/48]
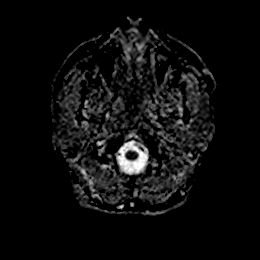
[im 16/48]
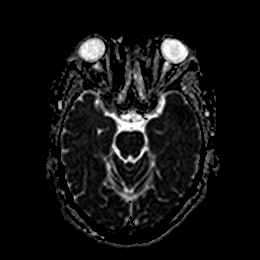
[im 32/48]
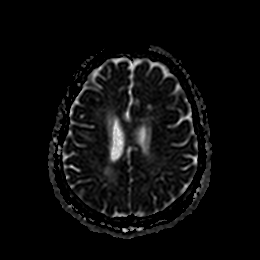
[im 48/48]
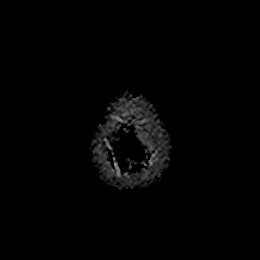

[Series 7: DWI · coronal · 4.0mm · 0.88mm/px · 6 of 64 slices shown (3 of 4)]
[im 1/64]
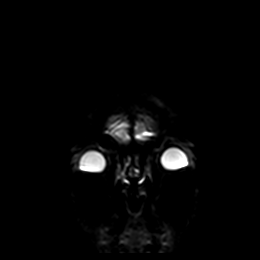
[im 13/64]
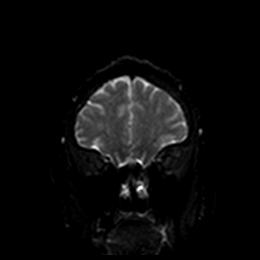
[im 26/64]
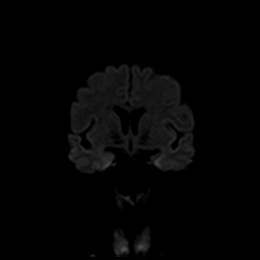
[im 38/64]
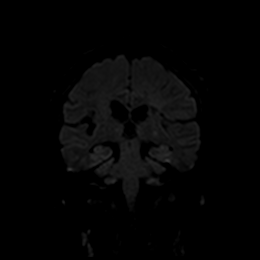
[im 51/64]
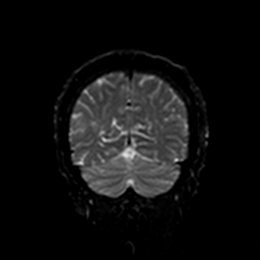
[im 64/64]
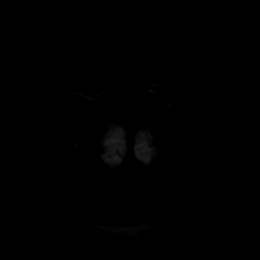

[Series 8: DWI · coronal · 4.0mm · 0.88mm/px · 3 of 32 slices shown (4 of 4)]
[im 1/32]
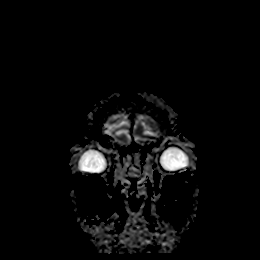
[im 16/32]
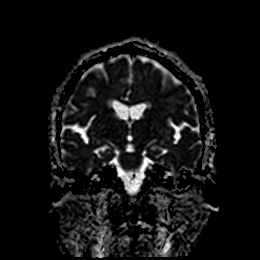
[im 32/32]
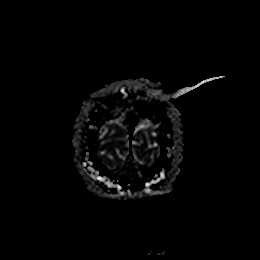

[Series 9: T1 · sagittal · 5.0mm · 0.75mm/px · 2 of 23 slices shown]
[im 1/23]
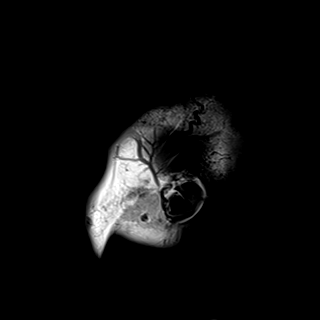
[im 23/23]
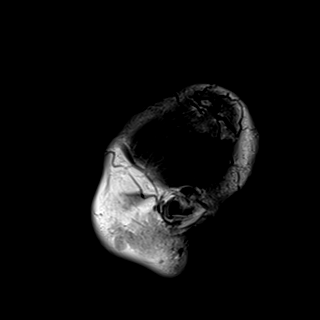

[Series 10: T2 · axial · 5.0mm · 0.72mm/px · z∈[-100,+43]mm · 2 of 25 slices shown (1 of 2)]
[im 1/25]
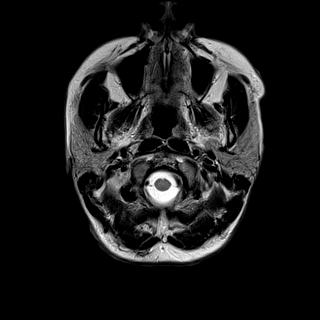
[im 25/25]
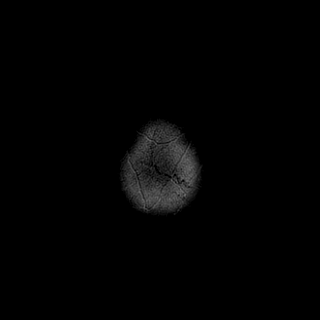

[Series 11: FLAIR · axial · 5.0mm · 0.45mm/px · z∈[-100,+43]mm · 2 of 25 slices shown]
[im 1/25]
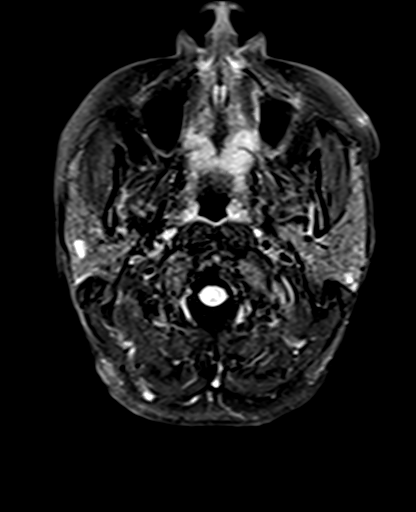
[im 25/25]
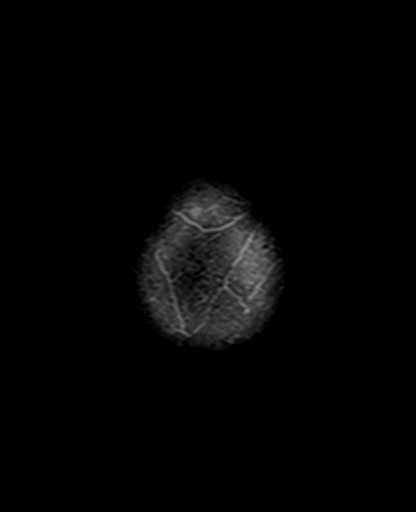

[Series 12: mag_images · axial · 3.0mm · 0.90mm/px · z∈[-95,+45]mm · 4 of 48 slices shown]
[im 1/48]
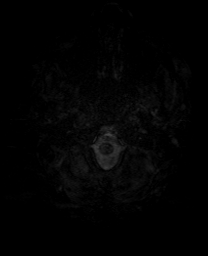
[im 16/48]
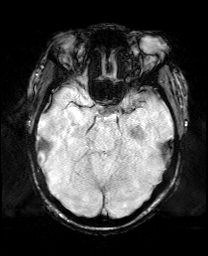
[im 32/48]
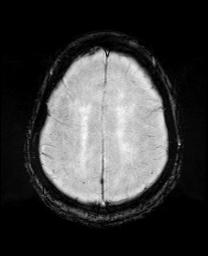
[im 48/48]
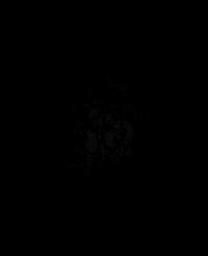

[Series 13: pha_images · axial · 3.0mm · 0.90mm/px · z∈[-95,+45]mm · 4 of 48 slices shown]
[im 1/48]
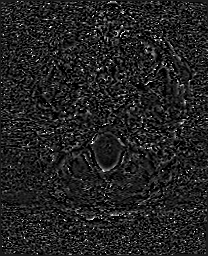
[im 16/48]
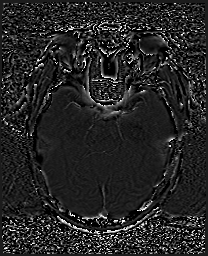
[im 32/48]
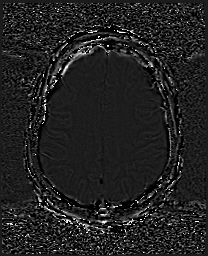
[im 48/48]
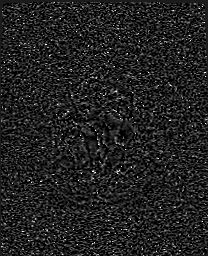

[Series 14: swi_images · axial · 3.0mm · 0.90mm/px · z∈[-95,+45]mm · 4 of 48 slices shown]
[im 1/48]
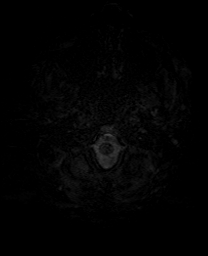
[im 16/48]
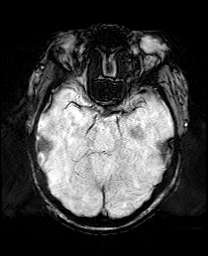
[im 32/48]
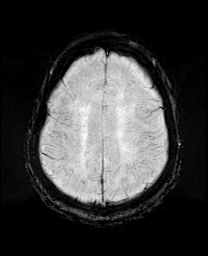
[im 48/48]
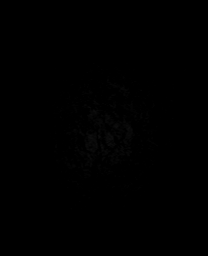

[Series 17: T2 · coronal · 5.0mm · 0.34mm/px · 3 of 29 slices shown (2 of 2)]
[im 1/29]
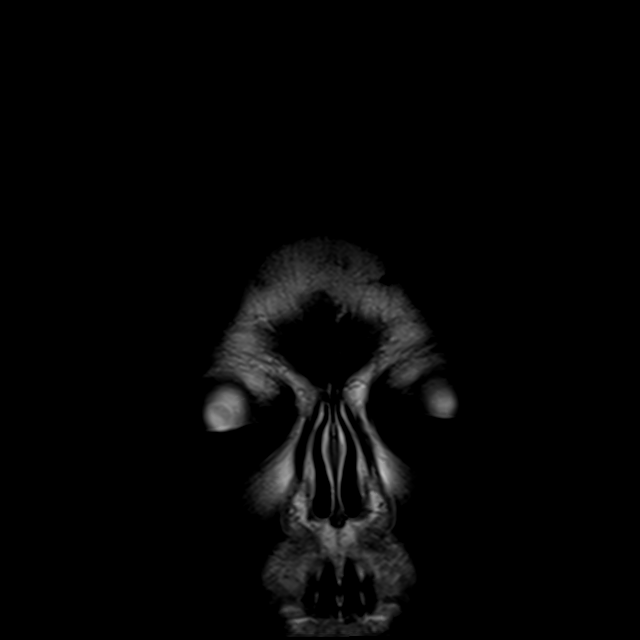
[im 15/29]
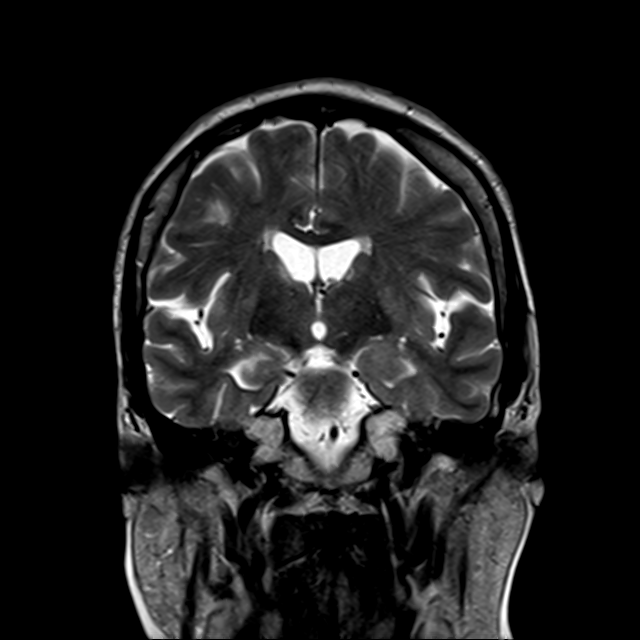
[im 29/29]
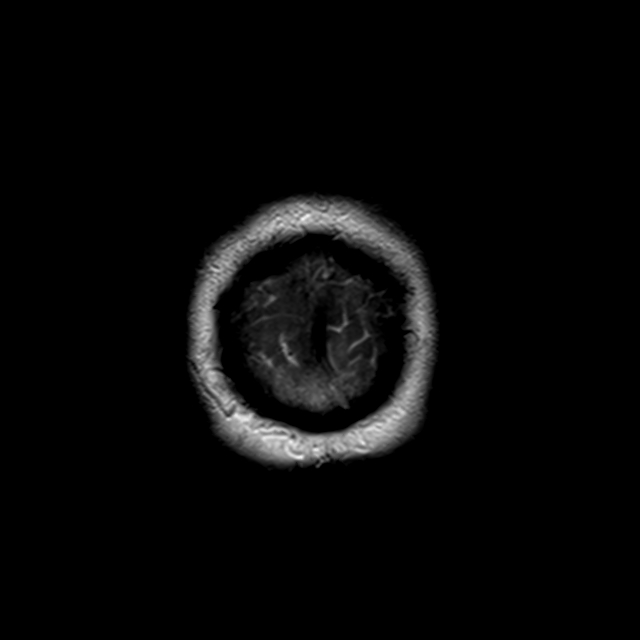

[44 of 48 positions shown; findings below may reference images not displayed]

FINDINGS: Brain: No acute infarction, hemorrhage, hydrocephalus, extra-axial
collection or mass lesion. Remote infarct in the left corona
radiata. Additional mild-to-moderate, age advanced T2/FLAIR
hyperintensities within the white matter and pons.

Vascular: Major arterial flow voids are maintained at the skull
base.

Skull and upper cervical spine: Normal marrow signal.

Sinuses/Orbits: Clear sinuses.  Unremarkable orbits.

Other: Trace bilateral mastoid fluid.
IMPRESSION: 1. No evidence of acute intracranial abnormality. Specifically, no
acute infarct.
2. Remote left corona radiata lacunar infarct.
3. Age-advanced mild-to-moderate scattered T2/FLAIR
hyperintensities, which are nonspecific but most likely related to
chronic microvascular ischemic disease given the patient's risk
factors (including hypertension and a hyperlipidemia).

## 8386-10-13 DEATH — deceased
